# Patient Record
Sex: Male | Born: 1970 | Race: White | Hispanic: No | Marital: Single | Smoking: Current some day smoker
Health system: Southern US, Community
[De-identification: ages and names within clinical notes are randomized; demographics above are authoritative.]

## PROBLEM LIST (undated history)

## (undated) DIAGNOSIS — K029 Dental caries, unspecified: Secondary | ICD-10-CM

## (undated) DIAGNOSIS — E7800 Pure hypercholesterolemia, unspecified: Secondary | ICD-10-CM

## (undated) HISTORY — PX: OTHER SURGICAL HISTORY: SHX169

---

## 2017-07-25 NOTE — H&P (Signed)
HISTORY AND PHYSICAL  Richard Oneal is a 47 y.o. male patient with CC: Referred by general dentist for multiple extractions in preparation for dentures.  No diagnosis found.  No past medical history on file.  No current facility-administered medications for this encounter.    Current Outpatient Medications  Medication Sig Dispense Refill  . atorvastatin (LIPITOR) 20 MG tablet Take 20 mg by mouth daily.     No Known Allergies Active Problems:   * No active hospital problems. *  Vitals: There were no vitals taken for this visit. Lab results:No results found for this or any previous visit (from the past 24 hour(s)). Radiology Results: No results found. General appearance: alert, cooperative and no distress Head: Normocephalic, without obvious abnormality, atraumatic Eyes: negative Nose: Nares normal. Septum midline. Mucosa normal. No drainage or sinus tenderness. Throat: multiple carious teeth. Periodontal diseases. Pharynx clear Neck: no adenopathy, supple, symmetrical, trachea midline and thyroid not enlarged, symmetric, no tenderness/mass/nodules Resp: clear to auscultation bilaterally Cardio: regular rate and rhythm, S1, S2 normal, no murmur, click, rub or gallop  Assessment: Nonrestorable teeth secondary to dental caries    Plan: Full mouth extractions with alveoloplasty. GA. Day surgery.   Ocie Doyne 07/25/2017

## 2017-07-26 ENCOUNTER — Other Ambulatory Visit: Payer: Self-pay

## 2017-07-26 ENCOUNTER — Encounter (HOSPITAL_COMMUNITY): Payer: Self-pay | Admitting: *Deleted

## 2017-07-26 NOTE — Progress Notes (Signed)
Pt denies SOB, chest pain, and being under the care of a cardiologist. Pt denies having a stress test, echo and cardiac cath. Pt denies having an EKG and chest x ray within the last year. Pt denies recent labs. Pt made aware to stop taking  Aspirin, vitamins, fish oil and herbal medications. Do not take any NSAIDs ie: Ibuprofen, Advil, Naproxen (Aleve), Motrin, BC and Goody Powder. Pt verbalized understanding of all pre-op instructions. 

## 2017-07-27 ENCOUNTER — Ambulatory Visit (HOSPITAL_COMMUNITY): Payer: Medicaid Other | Admitting: Anesthesiology

## 2017-07-27 ENCOUNTER — Encounter (HOSPITAL_COMMUNITY)
Admission: RE | Disposition: A | Discharge status: Home / Self Care | Payer: Self-pay | Source: Ambulatory Visit | Attending: Oral Surgery

## 2017-07-27 ENCOUNTER — Ambulatory Visit (HOSPITAL_COMMUNITY)
Admission: RE | Admit: 2017-07-27 | Discharge: 2017-07-27 | Disposition: A | Discharge status: Home / Self Care | Payer: Medicaid Other | Source: Ambulatory Visit | Attending: Oral Surgery | Admitting: Oral Surgery

## 2017-07-27 ENCOUNTER — Encounter (HOSPITAL_COMMUNITY): Payer: Self-pay | Admitting: *Deleted

## 2017-07-27 DIAGNOSIS — K029 Dental caries, unspecified: Secondary | ICD-10-CM | POA: Diagnosis not present

## 2017-07-27 DIAGNOSIS — K0530 Chronic periodontitis, unspecified: Secondary | ICD-10-CM | POA: Diagnosis not present

## 2017-07-27 HISTORY — PX: MULTIPLE EXTRACTIONS WITH ALVEOLOPLASTY: SHX5342

## 2017-07-27 HISTORY — DX: Pure hypercholesterolemia, unspecified: E78.00

## 2017-07-27 HISTORY — DX: Dental caries, unspecified: K02.9

## 2017-07-27 LAB — CBC
HCT: 45.9 % (ref 39.0–52.0)
Hemoglobin: 15.5 g/dL (ref 13.0–17.0)
MCH: 31.5 pg (ref 26.0–34.0)
MCHC: 33.8 g/dL (ref 30.0–36.0)
MCV: 93.3 fL (ref 78.0–100.0)
Platelets: 225 10*3/uL (ref 150–400)
RBC: 4.92 MIL/uL (ref 4.22–5.81)
RDW: 12.7 % (ref 11.5–15.5)
WBC: 9.2 10*3/uL (ref 4.0–10.5)

## 2017-07-27 LAB — BASIC METABOLIC PANEL
Anion gap: 9 (ref 5–15)
BUN: 9 mg/dL (ref 6–20)
CALCIUM: 8.8 mg/dL — AB (ref 8.9–10.3)
CO2: 24 mmol/L (ref 22–32)
Chloride: 102 mmol/L (ref 101–111)
Creatinine, Ser: 0.85 mg/dL (ref 0.61–1.24)
GFR calc Af Amer: >60 mL/min (ref >60)
GLUCOSE: 96 mg/dL (ref 65–99)
Potassium: 3.8 mmol/L (ref 3.5–5.1)
Sodium: 135 mmol/L (ref 135–145)

## 2017-07-27 SURGERY — MULTIPLE EXTRACTION WITH ALVEOLOPLASTY
Anesthesia: General | Site: Mouth

## 2017-07-27 MED ORDER — OXYCODONE-ACETAMINOPHEN 5-325 MG PO TABS
1.0000 | ORAL_TABLET | ORAL | 0 refills | Status: DC | PRN
Start: 2017-07-27 — End: 2021-02-10

## 2017-07-27 MED ORDER — ROCURONIUM BROMIDE 10 MG/ML (PF) SYRINGE
PREFILLED_SYRINGE | INTRAVENOUS | Status: AC
  Filled 2017-07-27: qty 5

## 2017-07-27 MED ORDER — LACTATED RINGERS IV SOLN: INTRAVENOUS | Status: DC

## 2017-07-27 MED ORDER — SUGAMMADEX SODIUM 200 MG/2ML IV SOLN
INTRAVENOUS | Status: AC
  Filled 2017-07-27: qty 2

## 2017-07-27 MED ORDER — DEXAMETHASONE SODIUM PHOSPHATE 10 MG/ML IJ SOLN
INTRAMUSCULAR | Status: DC | PRN
  Administered 2017-07-27: 10 mg via INTRAVENOUS

## 2017-07-27 MED ORDER — CEFAZOLIN SODIUM-DEXTROSE 2-4 GM/100ML-% IV SOLN
2.0000 g | INTRAVENOUS | Status: AC
  Administered 2017-07-27: 2 g via INTRAVENOUS
  Filled 2017-07-27: qty 100

## 2017-07-27 MED ORDER — FENTANYL CITRATE (PF) 100 MCG/2ML IJ SOLN
INTRAMUSCULAR | Status: DC | PRN
  Administered 2017-07-27: 50 ug via INTRAVENOUS
  Administered 2017-07-27: 100 ug via INTRAVENOUS

## 2017-07-27 MED ORDER — METOCLOPRAMIDE HCL 5 MG/ML IJ SOLN: 10.0000 mg | Freq: Once | INTRAMUSCULAR | Status: DC | PRN

## 2017-07-27 MED ORDER — DEXAMETHASONE SODIUM PHOSPHATE 10 MG/ML IJ SOLN
INTRAMUSCULAR | Status: AC
  Filled 2017-07-27: qty 1

## 2017-07-27 MED ORDER — LIDOCAINE-EPINEPHRINE 2 %-1:100000 IJ SOLN
INTRAMUSCULAR | Status: AC
  Filled 2017-07-27: qty 1

## 2017-07-27 MED ORDER — 0.9 % SODIUM CHLORIDE (POUR BTL) OPTIME
TOPICAL | Status: DC | PRN
  Administered 2017-07-27: 1000 mL

## 2017-07-27 MED ORDER — MIDAZOLAM HCL 2 MG/2ML IJ SOLN
INTRAMUSCULAR | Status: AC
  Filled 2017-07-27: qty 2

## 2017-07-27 MED ORDER — FENTANYL CITRATE (PF) 100 MCG/2ML IJ SOLN: 25.0000 ug | INTRAMUSCULAR | Status: DC | PRN

## 2017-07-27 MED ORDER — PROPOFOL 10 MG/ML IV BOLUS
INTRAVENOUS | Status: DC | PRN
  Administered 2017-07-27: 180 mg via INTRAVENOUS

## 2017-07-27 MED ORDER — SUGAMMADEX SODIUM 200 MG/2ML IV SOLN
INTRAVENOUS | Status: DC | PRN
  Administered 2017-07-27: 200 mg via INTRAVENOUS

## 2017-07-27 MED ORDER — PROPOFOL 10 MG/ML IV BOLUS
INTRAVENOUS | Status: AC
  Filled 2017-07-27: qty 20

## 2017-07-27 MED ORDER — ONDANSETRON HCL 4 MG/2ML IJ SOLN
INTRAMUSCULAR | Status: DC | PRN
  Administered 2017-07-27: 4 mg via INTRAVENOUS

## 2017-07-27 MED ORDER — ROCURONIUM BROMIDE 100 MG/10ML IV SOLN
INTRAVENOUS | Status: DC | PRN
  Administered 2017-07-27: 40 mg via INTRAVENOUS

## 2017-07-27 MED ORDER — MIDAZOLAM HCL 5 MG/5ML IJ SOLN
INTRAMUSCULAR | Status: DC | PRN
  Administered 2017-07-27: 2 mg via INTRAVENOUS

## 2017-07-27 MED ORDER — FENTANYL CITRATE (PF) 250 MCG/5ML IJ SOLN
INTRAMUSCULAR | Status: AC
  Filled 2017-07-27: qty 5

## 2017-07-27 MED ORDER — AMOXICILLIN 500 MG PO CAPS: 500.0000 mg | ORAL_CAPSULE | Freq: Three times a day (TID) | ORAL | 0 refills | Status: DC

## 2017-07-27 MED ORDER — MEPERIDINE HCL 50 MG/ML IJ SOLN: 6.2500 mg | INTRAMUSCULAR | Status: DC | PRN

## 2017-07-27 MED ORDER — OXYMETAZOLINE HCL 0.05 % NA SOLN
NASAL | Status: DC | PRN
  Administered 2017-07-27: 1

## 2017-07-27 MED ORDER — LIDOCAINE-EPINEPHRINE 2 %-1:100000 IJ SOLN
INTRAMUSCULAR | Status: DC | PRN
  Administered 2017-07-27: 17 mL via INTRADERMAL

## 2017-07-27 MED ORDER — ONDANSETRON HCL 4 MG/2ML IJ SOLN
INTRAMUSCULAR | Status: AC
  Filled 2017-07-27: qty 2

## 2017-07-27 MED ORDER — SODIUM CHLORIDE 0.9 % IR SOLN
Status: DC | PRN
  Administered 2017-07-27: 1000 mL

## 2017-07-27 MED ORDER — OXYMETAZOLINE HCL 0.05 % NA SOLN
NASAL | Status: DC | PRN
  Administered 2017-07-27 (×2): 1 via NASAL

## 2017-07-27 MED ORDER — LIDOCAINE 2% (20 MG/ML) 5 ML SYRINGE
INTRAMUSCULAR | Status: AC
  Filled 2017-07-27: qty 5

## 2017-07-27 MED ORDER — LACTATED RINGERS IV SOLN
INTRAVENOUS | Status: DC | PRN
Start: 2017-07-27 — End: 2017-07-27
  Administered 2017-07-27: 07:00:00 via INTRAVENOUS

## 2017-07-27 MED ORDER — LIDOCAINE HCL (CARDIAC) PF 100 MG/5ML IV SOSY
PREFILLED_SYRINGE | INTRAVENOUS | Status: DC | PRN
  Administered 2017-07-27: 100 mg via INTRAVENOUS

## 2017-07-27 SURGICAL SUPPLY — 32 items
BUR CROSS CUT FISSURE 1.6 (BURR) ×2 IMPLANT
BUR CROSS CUT FISSURE 1.6MM (BURR) ×1
BUR EGG ELITE 4.0 (BURR) ×2 IMPLANT
BUR EGG ELITE 4.0MM (BURR) ×1
CANISTER SUCT 3000ML PPV (MISCELLANEOUS) ×3 IMPLANT
COVER SURGICAL LIGHT HANDLE (MISCELLANEOUS) ×3 IMPLANT
CRADLE DONUT ADULT HEAD (MISCELLANEOUS) ×3 IMPLANT
DECANTER SPIKE VIAL GLASS SM (MISCELLANEOUS) IMPLANT
DRAPE U-SHAPE 76X120 STRL (DRAPES) ×3 IMPLANT
FLUID NSS /IRRIG 1000 ML XXX (MISCELLANEOUS) ×3 IMPLANT
GAUZE PACKING FOLDED 2  STR (GAUZE/BANDAGES/DRESSINGS) ×2
GAUZE PACKING FOLDED 2 STR (GAUZE/BANDAGES/DRESSINGS) ×1 IMPLANT
GLOVE BIO SURGEON STRL SZ 6.5 (GLOVE) ×2 IMPLANT
GLOVE BIO SURGEON STRL SZ7.5 (GLOVE) ×3 IMPLANT
GLOVE BIO SURGEONS STRL SZ 6.5 (GLOVE) ×1
GLOVE BIOGEL PI IND STRL 7.0 (GLOVE) IMPLANT
GLOVE BIOGEL PI INDICATOR 7.0 (GLOVE)
GOWN STRL REUS W/ TWL LRG LVL3 (GOWN DISPOSABLE) ×1 IMPLANT
GOWN STRL REUS W/ TWL XL LVL3 (GOWN DISPOSABLE) ×1 IMPLANT
GOWN STRL REUS W/TWL LRG LVL3 (GOWN DISPOSABLE) ×2
GOWN STRL REUS W/TWL XL LVL3 (GOWN DISPOSABLE) ×2
KIT BASIN OR (CUSTOM PROCEDURE TRAY) ×3 IMPLANT
KIT TURNOVER KIT B (KITS) ×3 IMPLANT
NEEDLE 22X1 1/2 (OR ONLY) (NEEDLE) ×6 IMPLANT
NEEDLE 27GAX1X1/2 (NEEDLE) IMPLANT
NS IRRIG 1000ML POUR BTL (IV SOLUTION) ×3 IMPLANT
PAD ARMBOARD 7.5X6 YLW CONV (MISCELLANEOUS) ×3 IMPLANT
SUT CHROMIC 3 0 PS 2 (SUTURE) ×3 IMPLANT
SYR CONTROL 10ML LL (SYRINGE) ×3 IMPLANT
TRAY ENT MC OR (CUSTOM PROCEDURE TRAY) ×3 IMPLANT
TUBING IRRIGATION (MISCELLANEOUS) ×3 IMPLANT
YANKAUER SUCT BULB TIP NO VENT (SUCTIONS) ×3 IMPLANT

## 2017-07-27 NOTE — Op Note (Signed)
07/27/2017  8:15 AM  PATIENT:  Richard Oneal  47 y.o. male  PRE-OPERATIVE DIAGNOSIS:  NON RESTORABLE teeth 2,3,4,5,6,12,15,19,20,21,22,27,28,29  POST-OPERATIVE DIAGNOSIS:  SAME  PROCEDURE:  Procedure(s): EXTRACTION teeth 2,3,4,5,6,12,15,19,20,21,22,27,28,29 WITH ALVEOLOPLASTY  SURGEON:  Surgeon(s): Ocie Doyne, DDS  ANESTHESIA:   local and general  EBL:  minimal  DRAINS: none   SPECIMEN:  No Specimen  COUNTS:  YES  PLAN OF CARE: Discharge to home after PACU  PATIENT DISPOSITION:  PACU - hemodynamically stable.   PROCEDURE DETAILS: Dictation # [id number]  Georgia Lopes, DMD 07/27/2017 8:15 AM

## 2017-07-27 NOTE — Anesthesia Procedure Notes (Addendum)
Procedure Name: Intubation Date/Time: 07/27/2017 7:38 AM Performed by: Jenne Campus, CRNA Pre-anesthesia Checklist: Patient identified, Emergency Drugs available, Suction available and Patient being monitored Patient Re-evaluated:Patient Re-evaluated prior to induction Oxygen Delivery Method: Circle System Utilized Preoxygenation: Pre-oxygenation with 100% oxygen Induction Type: IV induction Ventilation: Mask ventilation without difficulty Laryngoscope Size: Mac and 3 Grade View: Grade I Nasal Tubes: Right, Nasal prep performed, Nasal Rae and Magill forceps- large, utilized Tube size: 7.0 mm Number of attempts: 1 Placement Confirmation: ETT inserted through vocal cords under direct vision,  positive ETCO2 and breath sounds checked- equal and bilateral Secured at: 28 cm Tube secured with: Tape Dental Injury: Teeth and Oropharynx as per pre-operative assessment

## 2017-07-27 NOTE — H&P (Signed)
H&P documentation  -History and Physical Reviewed  -Patient has been re-examined  -No change in the plan of care  Richard Oneal  

## 2017-07-27 NOTE — Anesthesia Preprocedure Evaluation (Addendum)
Anesthesia Evaluation  Patient identified by MRN, date of birth, ID band Patient awake    Reviewed: Allergy & Precautions, NPO status , Patient's Chart, lab work & pertinent test results  Airway Mallampati: II  TM Distance: >3 FB Neck ROM: Full    Dental no notable dental hx. (+) Poor Dentition, Loose, Missing, Chipped, Dental Advisory Given   Pulmonary neg pulmonary ROS, Current Smoker,    Pulmonary exam normal breath sounds clear to auscultation       Cardiovascular negative cardio ROS Normal cardiovascular exam Rhythm:Regular Rate:Normal     Neuro/Psych negative neurological ROS  negative psych ROS   GI/Hepatic negative GI ROS, Neg liver ROS,   Endo/Other  negative endocrine ROS  Renal/GU negative Renal ROS  negative genitourinary   Musculoskeletal negative musculoskeletal ROS (+)   Abdominal   Peds negative pediatric ROS (+)  Hematology negative hematology ROS (+)   Anesthesia Other Findings   Reproductive/Obstetrics negative OB ROS                            Anesthesia Physical Anesthesia Plan  ASA: II  Anesthesia Plan: General   Post-op Pain Management:    Induction: Intravenous  PONV Risk Score and Plan: 1 and Ondansetron and Treatment may vary due to age or medical condition  Airway Management Planned: Nasal ETT  Additional Equipment:   Intra-op Plan:   Post-operative Plan: Extubation in OR  Informed Consent: I have reviewed the patients History and Physical, chart, labs and discussed the procedure including the risks, benefits and alternatives for the proposed anesthesia with the patient or authorized representative who has indicated his/her understanding and acceptance.   Dental advisory given  Plan Discussed with: CRNA  Anesthesia Plan Comments:         Anesthesia Quick Evaluation

## 2017-07-27 NOTE — Transfer of Care (Signed)
Immediate Anesthesia Transfer of Care Note  Patient: Brody Bonneau  Procedure(s) Performed: EXTRACTION teeth 2,3,4,5,6,12,15,19,20,21,22,27,28,29 WITH ALVEOLOPLASTY (N/A Mouth)  Patient Location: PACU  Anesthesia Type:General  Level of Consciousness: awake, alert  and patient cooperative  Airway & Oxygen Therapy: Patient Spontanous Breathing  Post-op Assessment: Report given to RN and Post -op Vital signs reviewed and stable  Post vital signs: Reviewed and stable  Last Vitals:  Vitals Value Taken Time  BP 146/88 07/27/2017  8:22 AM  Temp    Pulse 77 07/27/2017  8:23 AM  Resp 24 07/27/2017  8:23 AM  SpO2 95 % 07/27/2017  8:23 AM  Vitals shown include unvalidated device data.  Last Pain:  Vitals:   07/27/17 0622  TempSrc:   PainSc: 0-No pain      Patients Stated Pain Goal: 4 (07/27/17 0622)  Complications: No apparent anesthesia complications

## 2017-07-27 NOTE — Op Note (Signed)
Richard Oneal, SILVIO MEDICAL RECORD [id number] ACCOUNT [id number] DATE OF BIRTH:05-10-1970 FACILITY: [facility] LOCATION: [facility] PHYSICIAN:Kendre Sires M. Jamarcus Laduke, DDS  OPERATIVE REPORT  DATE OF PROCEDURE:  07/27/2017  PREOPERATIVE DIAGNOSIS:  Nonrestorable teeth secondary to dental caries and periodontal disease, #2, #3, #4, #5, #6, #12, #15, #19, #20, #21, #22, #27, #28, #29.  POSTOPERATIVE DIAGNOSIS:  Nonrestorable teeth secondary to dental caries and periodontal disease, #2, #3, #4, #5, #6, #12, #15, #19, #20, #21, #22, #27, #28, #29.  PROCEDURE:  Extraction of teeth #2, #3, #4, #5, #6, #12, #15, #19, #20, #21, #22, #27, #28, #29, alveoplasty right and left maxilla and mandible.  SURGEON:  Ocie Doyne, DDS.  ANESTHESIA:  General nasal intubation.  DESCRIPTION OF PROCEDURE:  The patient was taken to the operating room and placed on the table in supine position.  General anesthesia was administered intravenously and a nasal endotracheal tube was placed and secured.  The eyes were protected.  The  patient was draped for surgery.  A timeout was performed.  The posterior pharynx was suctioned and a throat pack was placed.  Lidocaine 2% 1:100,000 epinephrine was infiltrated and an inferior alveolar block on the right and left sides and a buccal and  palatal infiltration of the maxilla.  A total of 17 mL was utilized.  A bite block was placed on the right side of the mouth and a sweetheart retractor was used to retract the tongue.  The left side was operated first.  A #15 blade was used to make an  incision around teeth numbers #19, #20, #21, #22 with an extension both proximally and distally of 1 cm on the alveolar crest.  Incision was made in both the buccal and lingual sulcus.  Another incision was made in the left maxilla beginning 1 cm  proximal to tooth #15 carried forward around the tooth and the gingival sulcus and forward along the alveolar crest and then around tooth #12 buccally and  palatally and then forward for 1 more cm distally.  The periosteum was then reflected from around  these teeth.  The teeth were elevated with a 301 elevator.  The lower teeth were removed with the lower Universal forceps, the upper teeth were removed with the upper Universal forceps.  Tooth #15 fractured upon removal requiring removal of additional  bone around the mesiobuccal root.  The root was then removed with a 301 elevator.  The periosteum was reflected to expose the alveolar crest.  The sockets were curetted and then alveoplasty was performed with an egg-shaped bur and bone file then the  areas were irrigated and closed with 3-0 chromic.  The bite block and sweetheart retractor were repositioned to the other side of the mouth and a similar incision was made around teeth numbers #2, #3, #4, #5 and #6 and around teeth numbers #27, #28 and  #29.  Periosteum was reflected.  The teeth were elevated with a 301 elevator and removed from the mouth with dental forceps.  The sockets were curetted.  The periosteum was reflected to expose the alveolar crest and alveoplasty was performed using the  egg-shaped bur and bone file.  The area was irrigated and closed with 3-0 chromic.  Then, the oral cavity was palpated and found to have good contour, hemostasis, closure.  The oral cavity was irrigated and suctioned, throat pack was removed.  The  patient was left in the care of anesthesia for extubation and transport to recovery room with plans for discharge home through day  surgery.  ESTIMATED BLOOD LOSS:  Minimum.  COMPLICATIONS:  None.  SPECIMENS:  None.  TN/NUANCE  D:07/27/2017 T:07/27/2017 JOB:[id number]

## 2017-07-27 NOTE — Anesthesia Postprocedure Evaluation (Signed)
Anesthesia Post Note  Patient: Richard Oneal  Procedure(s) Performed: EXTRACTION teeth [phone],3,4,5,6,12,15,19,20,21,22,27,28,29 WITH ALVEOLOPLASTY (N/A Mouth)     Patient location during evaluation: PACU Anesthesia Type: General Level of consciousness: awake and alert Pain management: pain level controlled Vital Signs Assessment: post-procedure vital signs reviewed and stable Respiratory status: spontaneous breathing, nonlabored ventilation, respiratory function stable and patient connected to nasal cannula oxygen Cardiovascular status: blood pressure returned to baseline and stable Postop Assessment: no apparent nausea or vomiting Anesthetic complications: no    Last Vitals:  Vitals:   07/27/17 0900 07/27/17 0901  BP:    Pulse: 70   Resp: 20   Temp:  36.7 C  SpO2: 95%     Last Pain:  Vitals:   07/27/17 0850  TempSrc:   PainSc: 0-No pain                 Phillips Groutarignan, Anthonella Klausner

## 2017-07-28 ENCOUNTER — Encounter (HOSPITAL_COMMUNITY): Payer: Self-pay | Admitting: Oral Surgery

## 2021-02-08 ENCOUNTER — Emergency Department (HOSPITAL_COMMUNITY): Payer: Medicaid Other | Admitting: Certified Registered"

## 2021-02-08 ENCOUNTER — Inpatient Hospital Stay (HOSPITAL_COMMUNITY): Payer: Medicaid Other

## 2021-02-08 ENCOUNTER — Inpatient Hospital Stay
Admission: AD | Admit: 2021-02-08 | Payer: Medicaid Other | Source: Other Acute Inpatient Hospital | Admitting: Student in an Organized Health Care Education/Training Program

## 2021-02-08 ENCOUNTER — Inpatient Hospital Stay: Admit: 2021-02-08 | Payer: Medicaid Other

## 2021-02-08 ENCOUNTER — Inpatient Hospital Stay (HOSPITAL_COMMUNITY)
Admission: EM | Admit: 2021-02-08 | Discharge: 2021-02-27 | DRG: 064 | Disposition: E | Payer: Medicaid Other | Attending: Pulmonary Disease | Admitting: Pulmonary Disease

## 2021-02-08 ENCOUNTER — Encounter (HOSPITAL_COMMUNITY): Admission: EM | Disposition: E | Payer: Self-pay | Source: Home / Self Care | Attending: Neurology

## 2021-02-08 ENCOUNTER — Emergency Department (HOSPITAL_COMMUNITY): Payer: Medicaid Other

## 2021-02-08 DIAGNOSIS — R112 Nausea with vomiting, unspecified: Secondary | ICD-10-CM

## 2021-02-08 DIAGNOSIS — J9589 Other postprocedural complications and disorders of respiratory system, not elsewhere classified: Secondary | ICD-10-CM | POA: Diagnosis not present

## 2021-02-08 DIAGNOSIS — Z8249 Family history of ischemic heart disease and other diseases of the circulatory system: Secondary | ICD-10-CM | POA: Diagnosis not present

## 2021-02-08 DIAGNOSIS — G911 Obstructive hydrocephalus: Secondary | ICD-10-CM | POA: Diagnosis not present

## 2021-02-08 DIAGNOSIS — Z66 Do not resuscitate: Secondary | ICD-10-CM | POA: Diagnosis not present

## 2021-02-08 DIAGNOSIS — R29810 Facial weakness: Secondary | ICD-10-CM | POA: Diagnosis present

## 2021-02-08 DIAGNOSIS — I63449 Cerebral infarction due to embolism of unspecified cerebellar artery: Secondary | ICD-10-CM | POA: Diagnosis present

## 2021-02-08 DIAGNOSIS — F17290 Nicotine dependence, other tobacco product, uncomplicated: Secondary | ICD-10-CM | POA: Diagnosis present

## 2021-02-08 DIAGNOSIS — G9382 Brain death: Secondary | ICD-10-CM | POA: Diagnosis not present

## 2021-02-08 DIAGNOSIS — I10 Essential (primary) hypertension: Secondary | ICD-10-CM | POA: Diagnosis present

## 2021-02-08 DIAGNOSIS — I651 Occlusion and stenosis of basilar artery: Secondary | ICD-10-CM

## 2021-02-08 DIAGNOSIS — I63233 Cerebral infarction due to unspecified occlusion or stenosis of bilateral carotid arteries: Principal | ICD-10-CM

## 2021-02-08 DIAGNOSIS — I63113 Cerebral infarction due to embolism of bilateral vertebral arteries: Secondary | ICD-10-CM | POA: Diagnosis present

## 2021-02-08 DIAGNOSIS — G935 Compression of brain: Secondary | ICD-10-CM | POA: Diagnosis not present

## 2021-02-08 DIAGNOSIS — Z529 Donor of unspecified organ or tissue: Secondary | ICD-10-CM | POA: Diagnosis not present

## 2021-02-08 DIAGNOSIS — R262 Difficulty in walking, not elsewhere classified: Secondary | ICD-10-CM | POA: Diagnosis present

## 2021-02-08 DIAGNOSIS — I7774 Dissection of vertebral artery: Secondary | ICD-10-CM | POA: Diagnosis not present

## 2021-02-08 DIAGNOSIS — Z79890 Hormone replacement therapy (postmenopausal): Secondary | ICD-10-CM

## 2021-02-08 DIAGNOSIS — Z515 Encounter for palliative care: Secondary | ICD-10-CM

## 2021-02-08 DIAGNOSIS — K279 Peptic ulcer, site unspecified, unspecified as acute or chronic, without hemorrhage or perforation: Secondary | ICD-10-CM | POA: Diagnosis present

## 2021-02-08 DIAGNOSIS — R4189 Other symptoms and signs involving cognitive functions and awareness: Secondary | ICD-10-CM | POA: Diagnosis present

## 2021-02-08 DIAGNOSIS — Z539 Procedure and treatment not carried out, unspecified reason: Secondary | ICD-10-CM | POA: Diagnosis not present

## 2021-02-08 DIAGNOSIS — E7800 Pure hypercholesterolemia, unspecified: Secondary | ICD-10-CM | POA: Diagnosis present

## 2021-02-08 DIAGNOSIS — I161 Hypertensive emergency: Secondary | ICD-10-CM | POA: Diagnosis present

## 2021-02-08 DIAGNOSIS — R471 Dysarthria and anarthria: Secondary | ICD-10-CM | POA: Diagnosis present

## 2021-02-08 DIAGNOSIS — I6312 Cerebral infarction due to embolism of basilar artery: Secondary | ICD-10-CM | POA: Diagnosis present

## 2021-02-08 DIAGNOSIS — I6389 Other cerebral infarction: Secondary | ICD-10-CM | POA: Diagnosis not present

## 2021-02-08 DIAGNOSIS — I69328 Other speech and language deficits following cerebral infarction: Secondary | ICD-10-CM

## 2021-02-08 DIAGNOSIS — I959 Hypotension, unspecified: Secondary | ICD-10-CM | POA: Diagnosis not present

## 2021-02-08 DIAGNOSIS — Z79899 Other long term (current) drug therapy: Secondary | ICD-10-CM | POA: Diagnosis not present

## 2021-02-08 DIAGNOSIS — Z452 Encounter for adjustment and management of vascular access device: Secondary | ICD-10-CM | POA: Diagnosis not present

## 2021-02-08 DIAGNOSIS — G919 Hydrocephalus, unspecified: Secondary | ICD-10-CM | POA: Diagnosis present

## 2021-02-08 DIAGNOSIS — I6503 Occlusion and stenosis of bilateral vertebral arteries: Secondary | ICD-10-CM | POA: Diagnosis not present

## 2021-02-08 DIAGNOSIS — J9601 Acute respiratory failure with hypoxia: Secondary | ICD-10-CM | POA: Diagnosis present

## 2021-02-08 DIAGNOSIS — J9690 Respiratory failure, unspecified, unspecified whether with hypoxia or hypercapnia: Secondary | ICD-10-CM

## 2021-02-08 DIAGNOSIS — Z9289 Personal history of other medical treatment: Secondary | ICD-10-CM

## 2021-02-08 DIAGNOSIS — R29702 NIHSS score 2: Secondary | ICD-10-CM | POA: Diagnosis present

## 2021-02-08 DIAGNOSIS — E876 Hypokalemia: Secondary | ICD-10-CM

## 2021-02-08 DIAGNOSIS — Z9911 Dependence on respirator [ventilator] status: Secondary | ICD-10-CM | POA: Diagnosis not present

## 2021-02-08 DIAGNOSIS — I63213 Cerebral infarction due to unspecified occlusion or stenosis of bilateral vertebral arteries: Secondary | ICD-10-CM | POA: Diagnosis not present

## 2021-02-08 DIAGNOSIS — R1110 Vomiting, unspecified: Secondary | ICD-10-CM

## 2021-02-08 DIAGNOSIS — Z20822 Contact with and (suspected) exposure to covid-19: Secondary | ICD-10-CM | POA: Diagnosis present

## 2021-02-08 DIAGNOSIS — Z0189 Encounter for other specified special examinations: Secondary | ICD-10-CM

## 2021-02-08 DIAGNOSIS — D649 Anemia, unspecified: Secondary | ICD-10-CM | POA: Diagnosis present

## 2021-02-08 HISTORY — PX: RADIOLOGY WITH ANESTHESIA: SHX6223

## 2021-02-08 HISTORY — PX: IR ANGIO VERTEBRAL SEL VERTEBRAL UNI R MOD SED: IMG5368

## 2021-02-08 HISTORY — PX: IR ANGIO INTRA EXTRACRAN SEL INTERNAL CAROTID BILAT MOD SED: IMG5363

## 2021-02-08 HISTORY — PX: IR CT HEAD LTD: IMG2386

## 2021-02-08 HISTORY — PX: IR PERCUTANEOUS ART THROMBECTOMY/INFUSION INTRACRANIAL INC DIAG ANGIO: IMG6087

## 2021-02-08 HISTORY — PX: IR US GUIDE VASC ACCESS RIGHT: IMG2390

## 2021-02-08 LAB — RAPID URINE DRUG SCREEN, HOSP PERFORMED
Amphetamines: NOT DETECTED
Barbiturates: NOT DETECTED
Benzodiazepines: NOT DETECTED
Cocaine: NOT DETECTED
Opiates: NOT DETECTED
Tetrahydrocannabinol: POSITIVE — AB

## 2021-02-08 LAB — COMPREHENSIVE METABOLIC PANEL
ALT: 18 U/L (ref 0–44)
AST: 17 U/L (ref 15–41)
Albumin: 4.6 g/dL (ref 3.5–5.0)
Alkaline Phosphatase: 55 U/L (ref 38–126)
Anion gap: 10 (ref 5–15)
BUN: 9 mg/dL (ref 6–20)
CO2: 23 mmol/L (ref 22–32)
Calcium: 9.1 mg/dL (ref 8.9–10.3)
Chloride: 106 mmol/L (ref 98–111)
Creatinine, Ser: 0.74 mg/dL (ref 0.61–1.24)
GFR, Estimated: >60 mL/min (ref >60)
Glucose, Bld: 130 mg/dL — ABNORMAL HIGH (ref 70–99)
Potassium: 3.5 mmol/L (ref 3.5–5.1)
Sodium: 139 mmol/L (ref 135–145)
Total Bilirubin: 0.9 mg/dL (ref 0.3–1.2)
Total Protein: 7.5 g/dL (ref 6.5–8.1)

## 2021-02-08 LAB — POCT I-STAT 7, (LYTES, BLD GAS, ICA,H+H)
Acid-base deficit: 1 mmol/L (ref 0.0–2.0)
Bicarbonate: 24.9 mmol/L (ref 20.0–28.0)
Calcium, Ion: 1.18 mmol/L (ref 1.15–1.40)
HCT: 44 % (ref 39.0–52.0)
Hemoglobin: 15 g/dL (ref 13.0–17.0)
O2 Saturation: 99 %
Potassium: 3.4 mmol/L — ABNORMAL LOW (ref 3.5–5.1)
Sodium: 139 mmol/L (ref 135–145)
TCO2: 26 mmol/L (ref 22–32)
pCO2 arterial: 42.8 mmHg (ref 32.0–48.0)
pH, Arterial: 7.373 (ref 7.350–7.450)
pO2, Arterial: 131 mmHg — ABNORMAL HIGH (ref 83.0–108.0)

## 2021-02-08 LAB — URINALYSIS, ROUTINE W REFLEX MICROSCOPIC
Bilirubin Urine: NEGATIVE
Glucose, UA: NEGATIVE mg/dL
Hgb urine dipstick: NEGATIVE
Ketones, ur: 15 mg/dL — AB
Leukocytes,Ua: NEGATIVE
Nitrite: NEGATIVE
Protein, ur: NEGATIVE mg/dL
Specific Gravity, Urine: 1.01 (ref 1.005–1.030)
pH: 7 (ref 5.0–8.0)

## 2021-02-08 LAB — GLUCOSE, CAPILLARY
Glucose-Capillary: 153 mg/dL — ABNORMAL HIGH (ref 70–99)
Glucose-Capillary: 149 mg/dL — ABNORMAL HIGH (ref 70–99)
Glucose-Capillary: 128 mg/dL — ABNORMAL HIGH (ref 70–99)

## 2021-02-08 LAB — CBC
HCT: 45.6 % (ref 39.0–52.0)
Hemoglobin: 15.8 g/dL (ref 13.0–17.0)
MCH: 30.8 pg (ref 26.0–34.0)
MCHC: 34.6 g/dL (ref 30.0–36.0)
MCV: 88.9 fL (ref 80.0–100.0)
Platelets: 246 10*3/uL (ref 150–400)
RBC: 5.13 MIL/uL (ref 4.22–5.81)
RDW: 12.1 % (ref 11.5–15.5)
WBC: 10.8 10*3/uL — ABNORMAL HIGH (ref 4.0–10.5)
nRBC: 0 % (ref 0.0–0.2)

## 2021-02-08 LAB — HIV ANTIBODY (ROUTINE TESTING W REFLEX): HIV Screen 4th Generation wRfx: NONREACTIVE

## 2021-02-08 LAB — MRSA NEXT GEN BY PCR, NASAL: MRSA by PCR Next Gen: NOT DETECTED

## 2021-02-08 IMAGING — XA IR PERCUTANEOUS ART THORMBECTOMY/INFUSION INTRACRANIAL INCLUDE D
11 of 16 series · 11 of 24 positions shown · IV contrast (IODINE)
Comparison: CT/CT angiogram of the head and neck [DATE].

INDICATION: 50-year-old male with past medical history significant for
hypertension hypercholesterolemia presented to outside hospital
complaining of headaches. There, he was noted to have slurred speech
and facial droop. His last known well was 8 p.m. on [DATE]. Head
CT showed no large acute territorial infarct. CT angiogram of the
head and neck showed bilateral intracranial vertebral artery and
proximal basilar artery occlusion. He was then transferred to our
service for a diagnostic cerebral angiogram and mechanical
thrombectomy.

EXAM:
ULTRASOUND-GUIDED VASCULAR [REDACTED] CEREBRAL ANGIOGRAM
ATTEMPTED BASILAR ARTERY REVASCULARIZATION
FLAT PANEL HEAD CT
TECHNIQUE: Informed written consent was obtained from the patient after a
thorough discussion of the procedural risks, benefits and
alternatives. All questions were addressed.

[Series 1: cerebral · 2 acquisitions, 1 frame shown (1 of 5)]
[im 1/2]
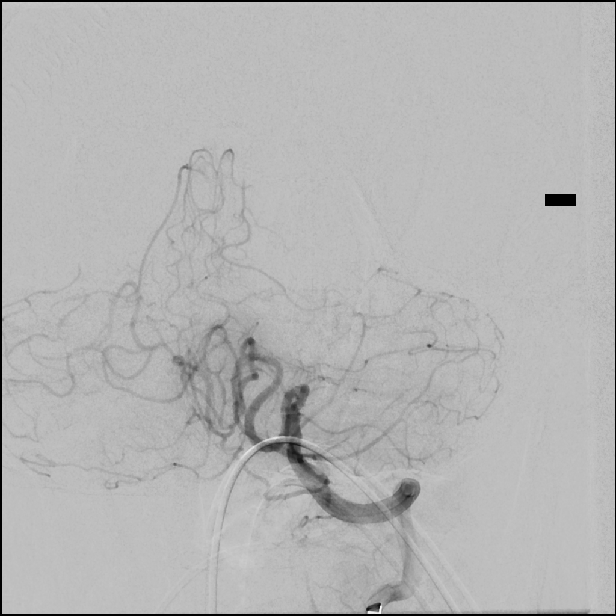

[Series 3: cerebral · 2 acquisitions, 1 frame shown (2 of 5)]
[im 1/2]
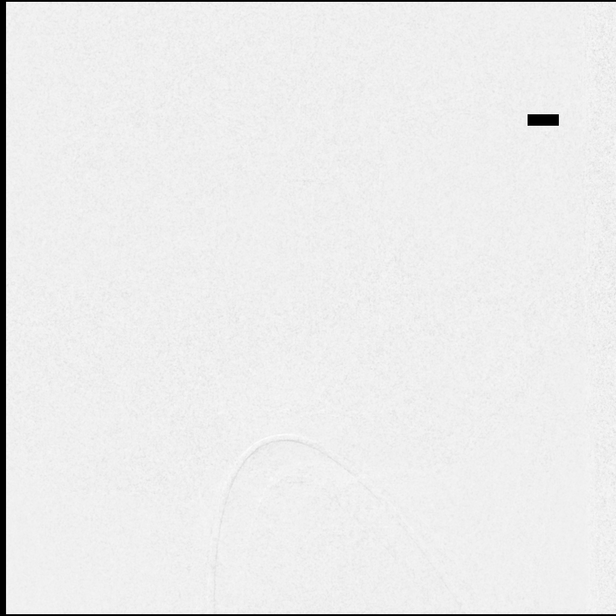

[Series 4: cerebral · 2 acquisitions, 1 frame shown (3 of 5)]
[im 1/2]
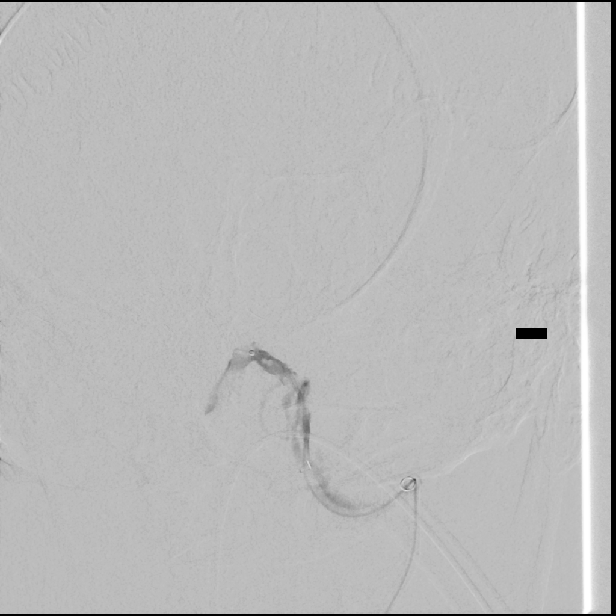

[Series 5: cerebral · 2 acquisitions, 1 frame shown (4 of 5)]
[im 1/2]
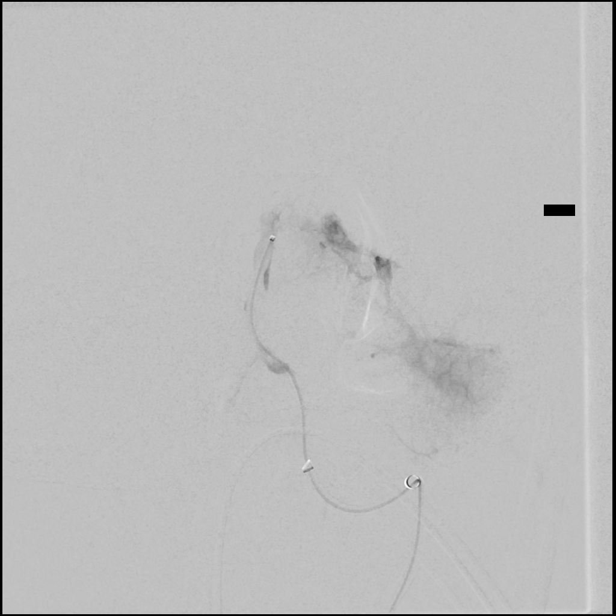

[Series 8: cerebral · 2 acquisitions, 1 frame shown (5 of 5)]
[im 1/2]
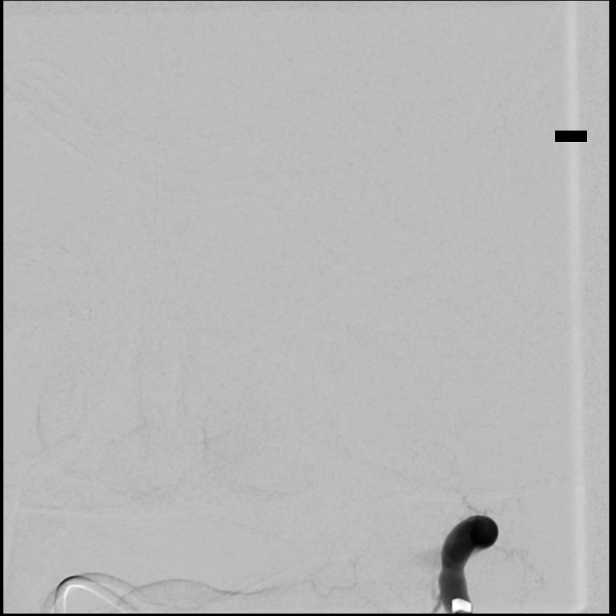

[Series 11: cerebral care 2 · 2 acquisitions, 1 frame shown (1 of 4)]
[im 1/2]
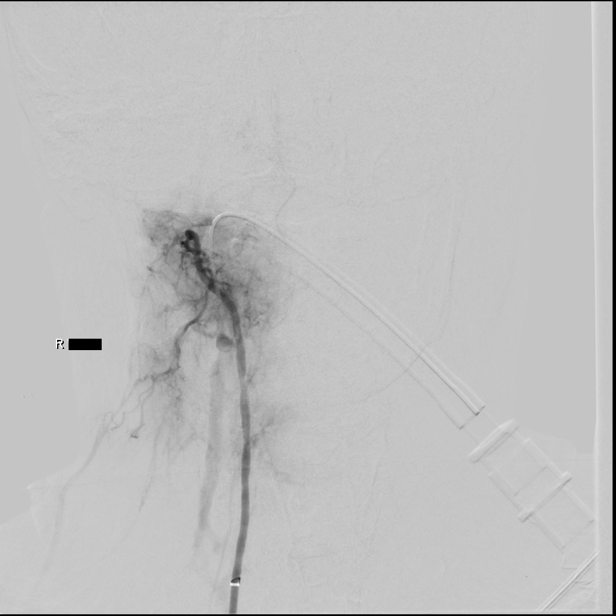

[Series 13: cerebral care 2 · 2 acquisitions, 1 frame shown (2 of 4)]
[im 1/2]
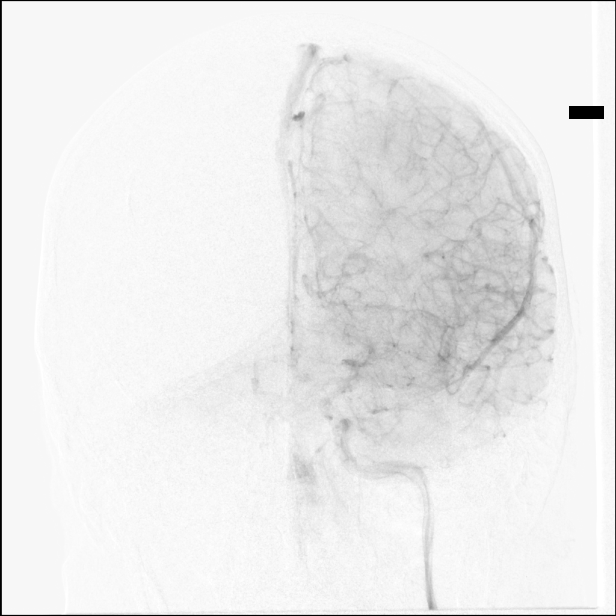

[Series 14: cerebral care 2 · 2 acquisitions, 1 frame shown (3 of 4)]
[im 1/2]
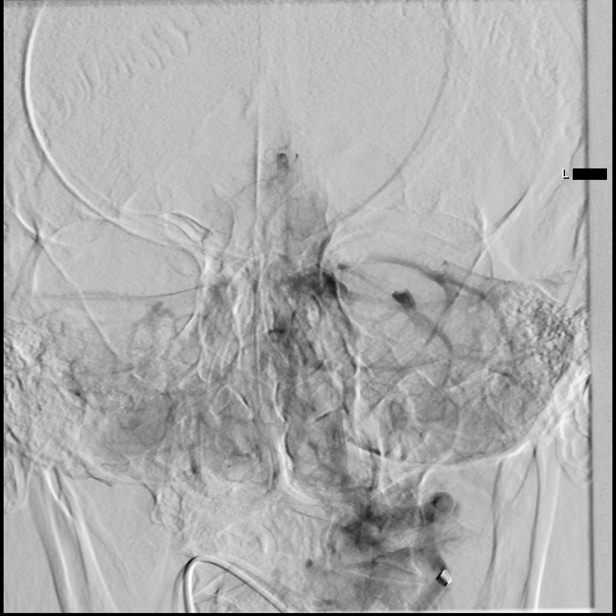

[Series 16: cerebral care 2 · 2 acquisitions, 1 frame shown (4 of 4)]
[im 1/2]
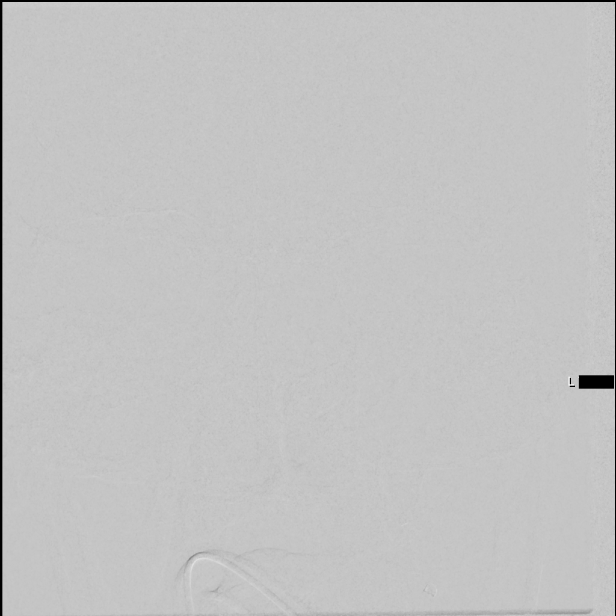

[Series 17: fl neuro n · 1 of 34 frames shown]
[frame 18/34]
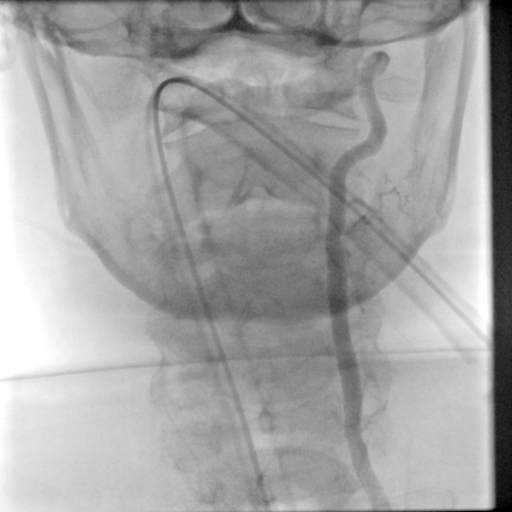

[Series 300: dr. (person_name) · 1 of 29 slices shown]
[im 17/29]
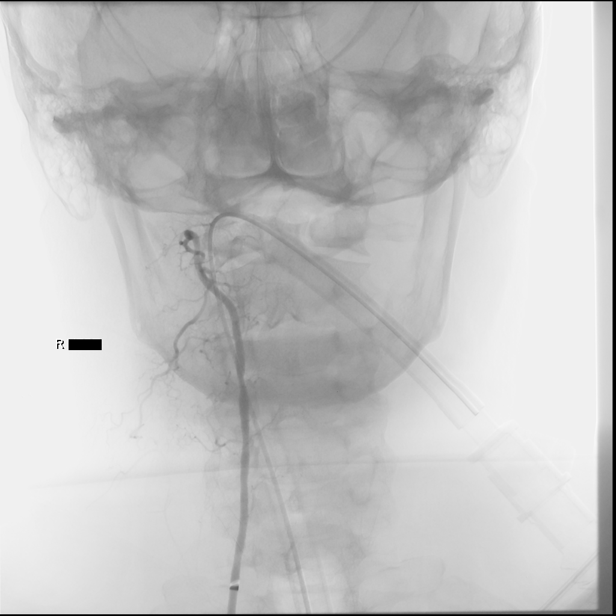

[11 of 24 positions shown; findings below may reference images not displayed]

MEDICATIONS:
Refer to anesthesia documentation

ANESTHESIA/SEDATION:
The procedure was performed under general anesthesia.

CONTRAST:  150 mL of Omnipaque 300 milligram/mL

FLUOROSCOPY TIME:  Fluoroscopy Time: 65 minutes 6 seconds (2,666
mGy).

COMPLICATIONS:
SIR LEVEL B - Normal therapy, includes overnight admission for
observation.
Maximal Sterile Barrier Technique was utilized including caps, mask,
sterile gowns, sterile gloves, sterile drape, hand hygiene and skin
antiseptic. A timeout was performed prior to the initiation of the
procedure.

The right groin was prepped and draped in the usual sterile fashion.
Using a micropuncture kit and the modified Seldinger technique,
access was gained to the right common femoral artery and an 8 French
sheath was placed. Real-time ultrasound guidance was utilized for
vascular access including the acquisition of a permanent ultrasound
image documenting patency of the accessed vessel.

Under fluoroscopy, a Zoom 88 guide catheter was navigated over a 6
RTOYOTA 2 catheter and a 0.035" Terumo Glidewire into the
aortic arch. The catheter was placed into the left subclavian artery
and then advanced into the left vertebral artery. The diagnostic
catheter was removed. Frontal and lateral angiograms of the head
were obtained.
FINDINGS: 1. Normal caliber of the right common femoral artery, adequate for
vascular access.
2. Occlusion of the left vertebral artery just distal to the PICA
origin.

PROCEDURE:
Under biplane roadmap, a zoom 71 aspiration catheter was navigated
into the left vertebral artery. The aspiration catheter connected to
an aspiration pump then advanced towards the level of occlusion in
the left vertebral artery. The catheter was noted to herniate into
the left PICA and, therefore, the catheter was retracted.

Frontal and lateral angiograms of the head were obtained in use at
biplane roadmap.

Then, coaxial navigation of a phenom 21 was performed over an
Aristotle 14 micro guidewire into the intracranial left vertebral
artery. The catheter was navigated over the wire into the
vertebrobasilar junction. Frontal lateral angiograms were obtained
via microcatheter contrast injection. Contrast was seen within
distal bilateral vertebral arteries. No progression of contrast into
the basilar artery noted. Further navigation of the catheter into
the basilar artery was obtained without difficulty. However, non
progression of the wire into the expected PCAs location was seen.
Magnified frontal and lateral angiograms were obtained via
microcatheter contrast injection. Contrast opacification was seen to
be along the anterior wall of the basilar artery with flame shaped
distally and no opacification of the posterior cerebral artery.
Findings are consistent with dissection.

Flat panel CT of the head was obtained and post processed in a
separate workstation with concurrent attending physician
supervision. Selected images were sent to PACS. No evidence of
contrast extravasation noted.

The microcatheter was retracted into the proximal intradural left
vertebral artery. Frontal and lateral angiograms of the neck were
obtained showing contrast opacification along the the false lumen in
the basilar artery.

Multiple attempts performed to gain access to the true lumen of the
basilar artery proved unsuccessful. The microcatheter was
subsequently withdrawn into the guide catheter.

Flat panel CT of the head was obtained and post processed in a
separate workstation with concurrent attending physician
supervision. Selected images were sent to PACS. No evidence of
contrast extravasation after removal of microcatheter from false
lumen.

Follow-up left vertebral artery angiograms showed stable findings.

The meantime the catheter was subsequently withdrawn.

A right common femoral artery angiogram showed access at the level
of the deep artery of the thigh with high femoral artery
bifurcation.

Under fluoroscopy, a Zoom 88 guide catheter was navigated over a 6
RTOYOTA 2 catheter and a 0.035" Terumo Glidewire into the
aortic arch. The catheter was placed into the right subclavian
artery and then advanced into the right vertebral artery. The
diagnostic catheter was removed. Frontal and lateral angiograms of
the head were obtained. The V2 segment of the right vertebral artery
ends at the distal V2 segment as a muscular branch with no
connection visible to the basilar artery.

The catheter was subsequently advanced over the wire and under
biplane roadmap into the right internal carotid artery. Frontal and
lateral angiograms of the head were obtained. There is brisk
contrast opacification of the right ACA and MCA vascular trees with
delayed opacification of the left PCA an SCA primarily via
leptomeningeal collaterals. No large posterior communicating artery
identified.

Under roadmap guidance, the catheter was then navigated into the
left internal carotid artery. Frontal and lateral angiograms of the
head were obtained. There is brisk contrast opacification of the
left ACA and MCA vascular tree with opacification of the basilar tip
and left PCA vascular tree seen via diminutive posterior
communicating artery and leptomeningeal collaterals.

Next, the catheter was again advanced into the left vertebral
artery. Frontal and lateral angiograms of the head were obtained and
used as biplane roadmap. Then, triaxial navigation of a phenom 21
was performed through a zoom 71 and over an Aristotle 14 micro
guidewire into the intracranial left vertebral artery. Attempt to
advance the microcatheter into the true lumen resulted in herniation
into the false lumen. The microcatheter was then left in the false
lumen as an attempt to block this pathway. Subsequently, a Prowler
microcatheter was navigated over a synchro 2 micro guidewire into
the left vertebral artery. Attempt to gain access to the true lumen
proved unsuccessful the catheter eventually herniating into the
false lumen. Both catheters were subsequently withdrawn. Control
angiograms showed persistent occlusion of the right vertebral artery
distal to the PICA. Flow to the PICA appear preserved. Interval
system was subsequently withdrawn.

Then, a Perclose pro style was utilized for access closure.
Immediate hemostasis was achieved.
IMPRESSION: Unsuccessful attempt to revascularize distal intracranial left
vertebral artery and basilar artery complicated by dissection.
Multiple attempts to gain access to the true lumen proved
unsuccessful and, therefore, procedure was aborted.

PLAN:
Patient will be transferred to MRI to evaluate stroke and vessel
patency. Further management should be decided after MRI in
conjunction with the neurology team. Family updated by telephone.

## 2021-02-08 IMAGING — MR MR MRA HEAD W/O CM
1 series · 18 of 48 positions shown · non-contrast
Comparison: Same day catheter arteriogram and CTA.

CLINICAL DATA: Stroke, follow up

EXAM:
MRI HEAD WITHOUT CONTRAST
MRA HEAD WITHOUT CONTRAST
TECHNIQUE: Multiplanar, multi-echo pulse sequences of the brain and surrounding
structures were acquired without intravenous contrast. Angiographic
images of the Circle of Willis were acquired using MRA technique
without intravenous contrast.

[Series 5: 3d cow · axial · 0.5mm · 0.43mm/px · z∈[-57,+24]mm · 18 of 172 slices shown]
[im 1/172]
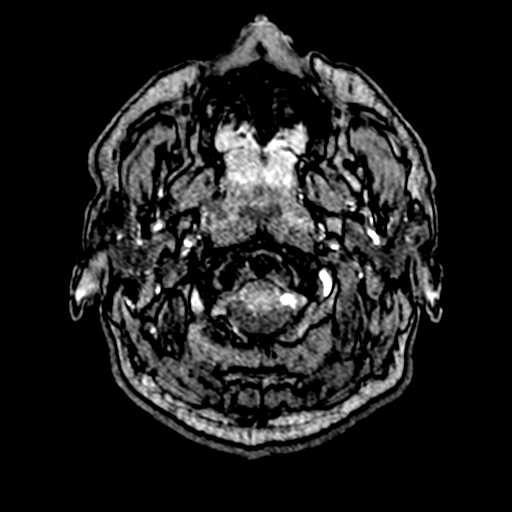
[im 4/172]
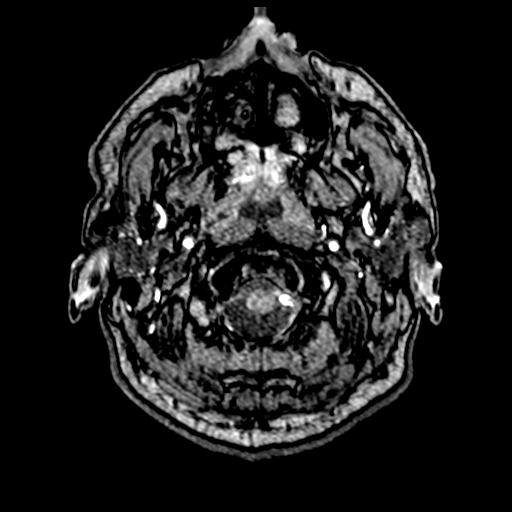
[im 8/172]
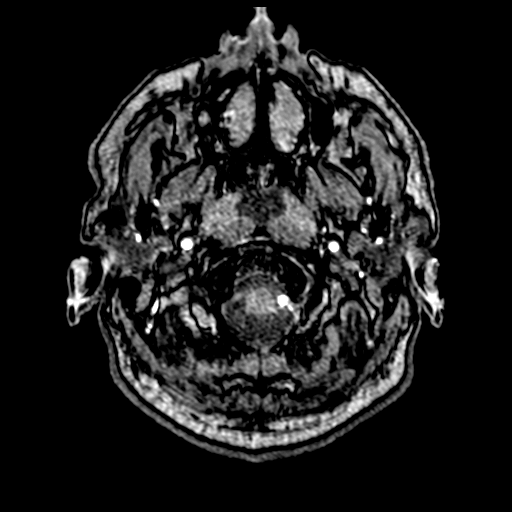
[im 11/172]
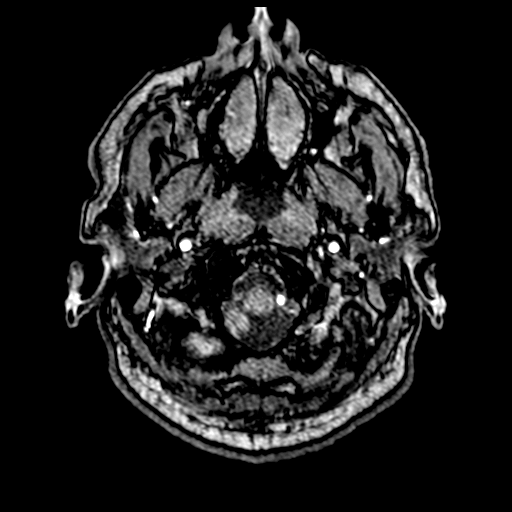
[im 15/172]
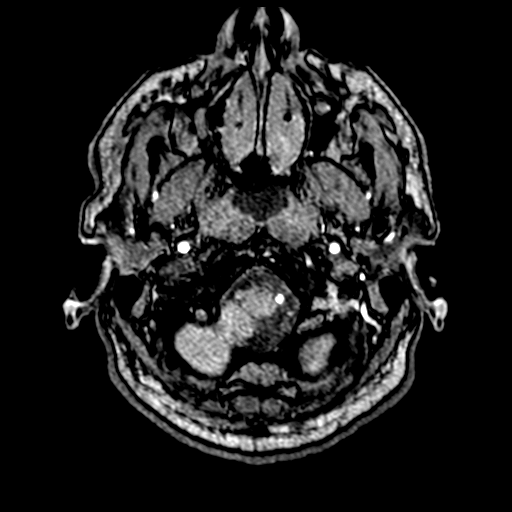
[im 19/172]
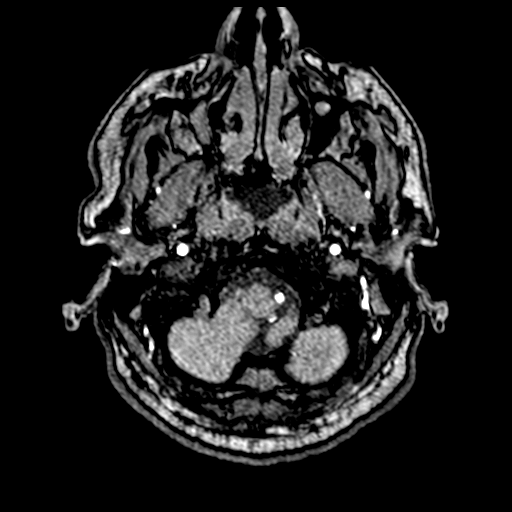
[im 22/172]
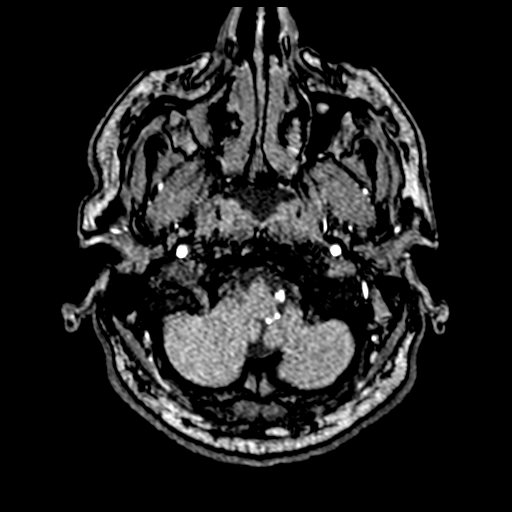
[im 26/172]
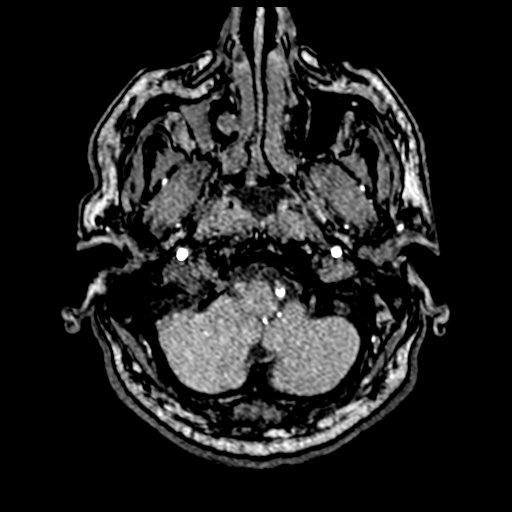
[im 30/172]
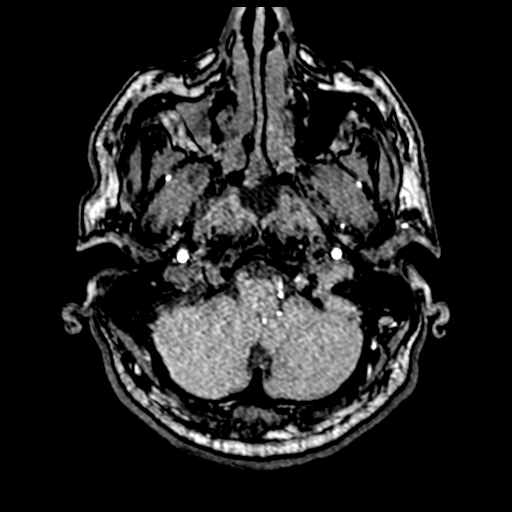
[im 33/172]
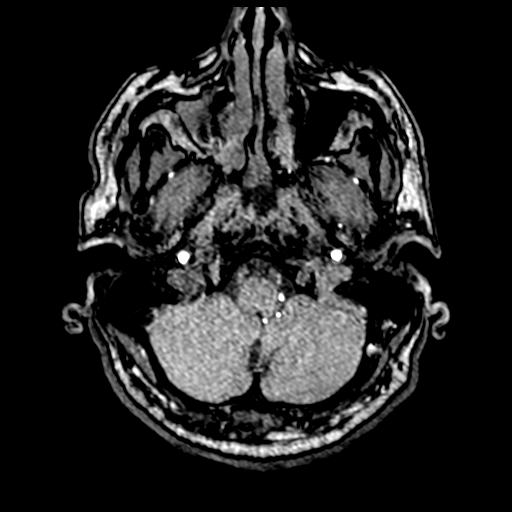
[im 55/172]
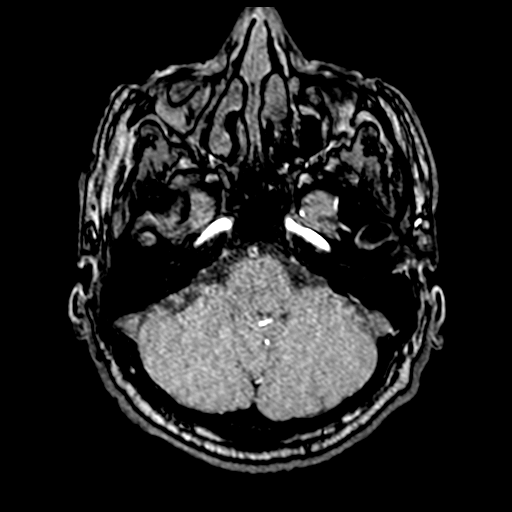
[im 77/172]
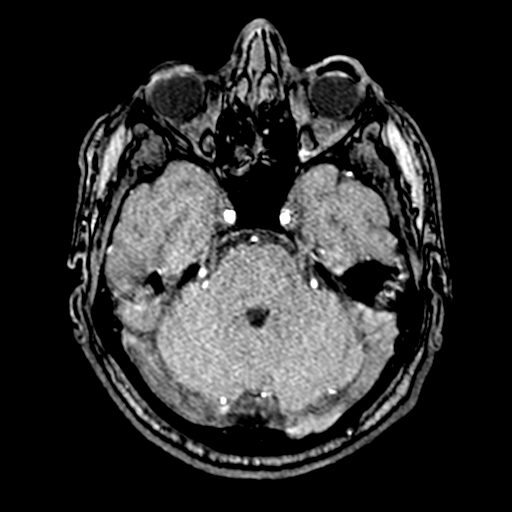
[im 88/172]
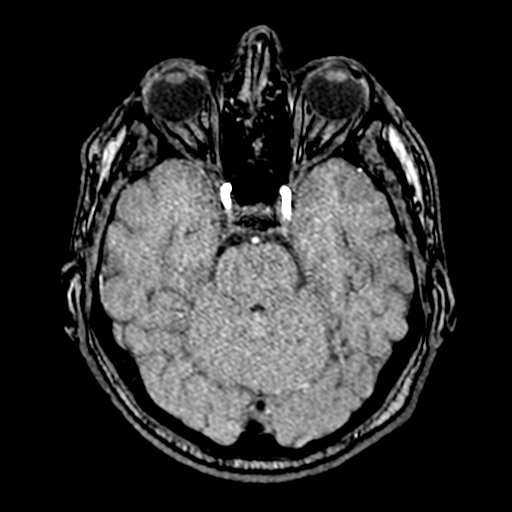
[im 99/172]
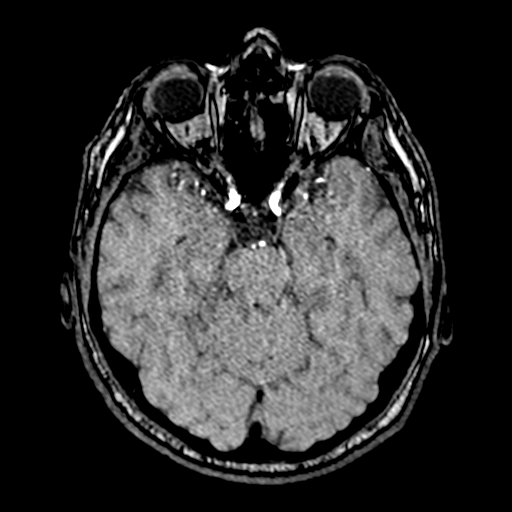
[im 121/172]
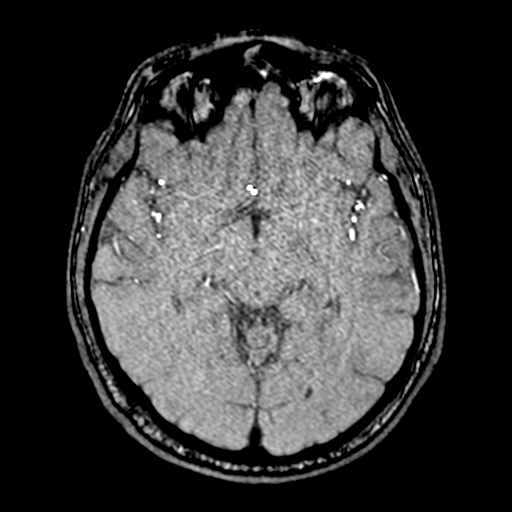
[im 142/172]
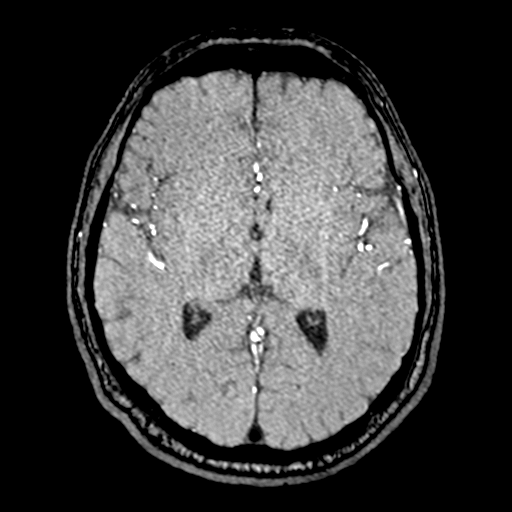
[im 146/172]
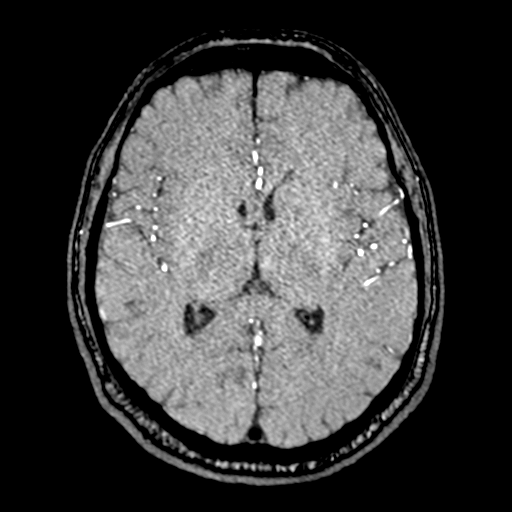
[im 164/172]
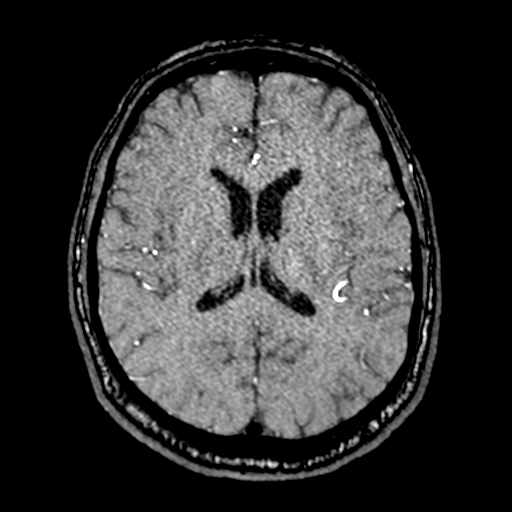

[18 of 48 positions shown; findings below may reference images not displayed]

FINDINGS: MRI HEAD FINDINGS

Brain: Acute infarcts within the left greater than right cerebellum,
pons and left thalamus. Mild edema without mass effect. Punctate
acute infarct in the high right parietal white matter (series 5,
image 90). No hydrocephalus, mass lesion, midline shift, or
extra-axial fluid collection

Vascular: Arterial vasculature is assessed below. T2 hyperintensity
within the left transverse and sigmoid sinuses likely represents
slow flow given these sinuses were patent on same day CTA.

Skull and upper cervical spine: Normal marrow signal.

Sinuses/Orbits: Moderate paranasal sinus mucosal thickening.
Unremarkable orbits.

Other: No mastoid effusions.

MRA HEAD FINDINGS

Anterior circulation: Bilateral intracranial ICAs are patent.
Bilateral M1 MCAs and ACAs are patent without proximal high-grade
stenosis. High-grade right M2 MCA stenosis.

Posterior circulation: No flow related signal within the right
intradural vertebral artery, compatible with known occlusion. The
proximal left intradural vertebral artery demonstrates flow related
signal up until the PICA origin. The left PICA appears patent.
Distal to the PICA origin there is minimal flow related signal,
which may be within a false lumen given findings on recent catheter
arteriogram and CTA. No flow related signal within the proximal
basilar artery, compatible with occlusion. Minimal flow related
signal in the distal basilar artery. Focus of low intensity signal
within the basilar tip may represent stagnant contrast from recent
catheter arteriogram. Small bilateral posterior communicating
arteries and posterior cerebral arteries. Poor flow related signal
in the distal PCAs bilaterally.
IMPRESSION: MRI:

1. Acute infarcts within the left greater than right cerebellum,
pons, and left thalamus.Mild edema without mass effect.
2. Additional punctate acute infarct in the high right parietal
white matter.

MRA:

1. Findings compatible with bilateral intradural vertebral artery
and basilar artery occlusion, as detailed above.
2. Small and poorly opacified PCAs bilaterally with small bilateral
posterior communicating arteries.
3. Please see recent catheter arteriogram for additional details.

These results will be called to the ordering clinician or
representative by the Radiologist Assistant, and communication
documented in the PACS or [REDACTED].

## 2021-02-08 IMAGING — MR MR HEAD W/O CM
12 of 13 series · 44 of 48 positions shown · non-contrast
Comparison: Same day catheter arteriogram and CTA.

CLINICAL DATA: Stroke, follow up

EXAM:
MRI HEAD WITHOUT CONTRAST
MRA HEAD WITHOUT CONTRAST
TECHNIQUE: Multiplanar, multi-echo pulse sequences of the brain and surrounding
structures were acquired without intravenous contrast. Angiographic
images of the Circle of Willis were acquired using MRA technique
without intravenous contrast.

[Series 5: DWI · axial · 3.0mm · 0.88mm/px · z∈[-64,+87]mm · 8 of 104 slices shown (1 of 4)]
[im 1/104]
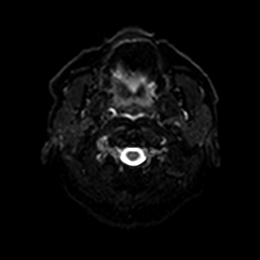
[im 15/104]
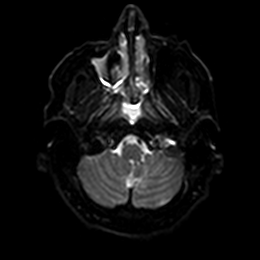
[im 30/104]
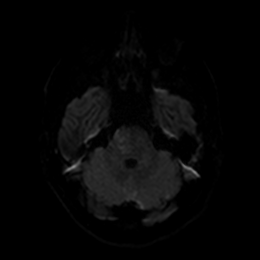
[im 45/104]
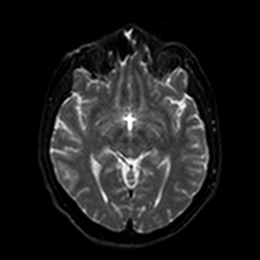
[im 59/104]
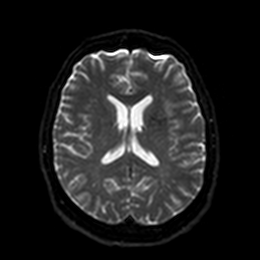
[im 74/104]
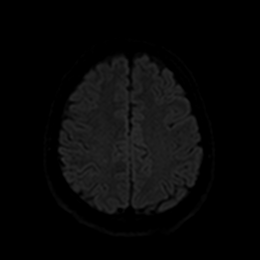
[im 89/104]
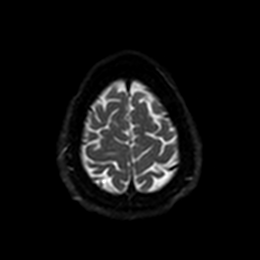
[im 104/104]
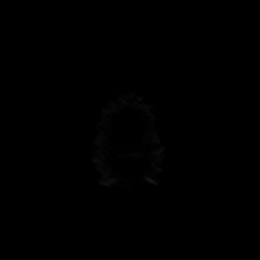

[Series 6: DWI · axial · 3.0mm · 0.88mm/px · z∈[-64,+87]mm · 4 of 52 slices shown (2 of 4)]
[im 1/52]
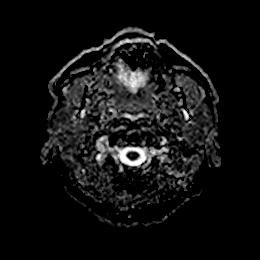
[im 18/52]
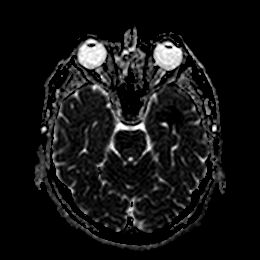
[im 35/52]
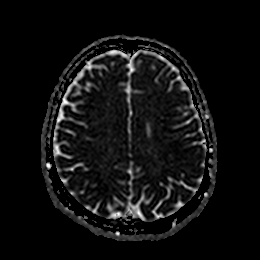
[im 52/52]
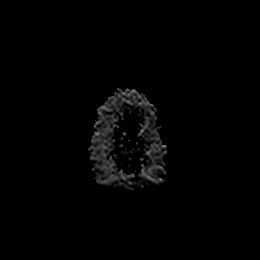

[Series 7: DWI · coronal · 4.0mm · 0.88mm/px · 5 of 72 slices shown (3 of 4)]
[im 1/72]
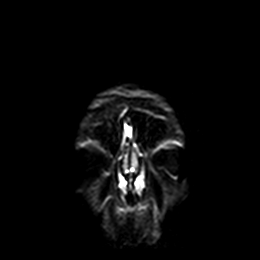
[im 18/72]
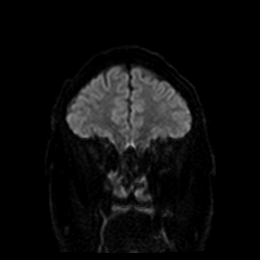
[im 36/72]
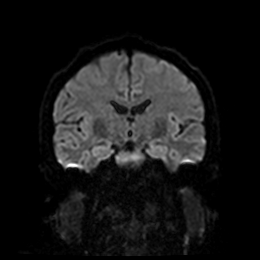
[im 54/72]
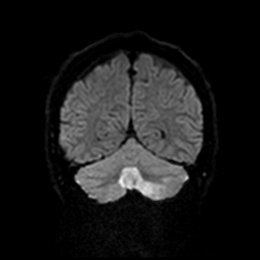
[im 72/72]
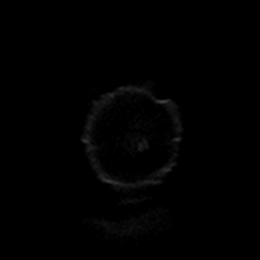

[Series 8: DWI · coronal · 4.0mm · 0.88mm/px · 3 of 36 slices shown (4 of 4)]
[im 1/36]
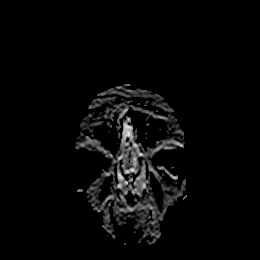
[im 18/36]
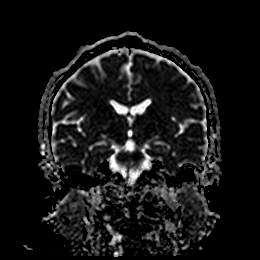
[im 36/36]
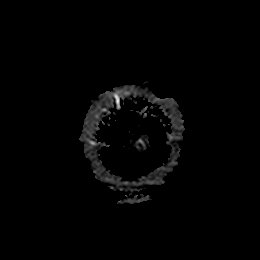

[Series 9: T1 · sagittal · 5.0mm · 0.75mm/px · 2 of 23 slices shown]
[im 1/23]
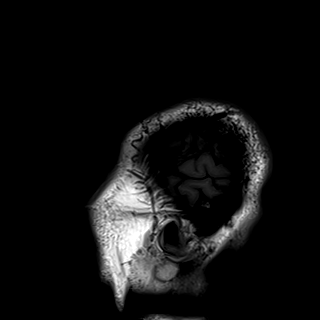
[im 23/23]
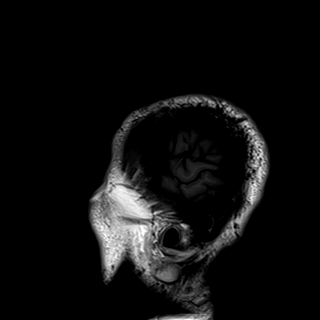

[Series 10: T2 · axial · 5.0mm · 0.72mm/px · z∈[-67,+76]mm · 2 of 25 slices shown (1 of 2)]
[im 1/25]
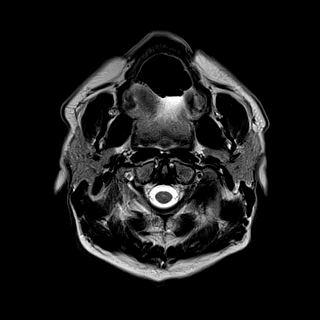
[im 25/25]
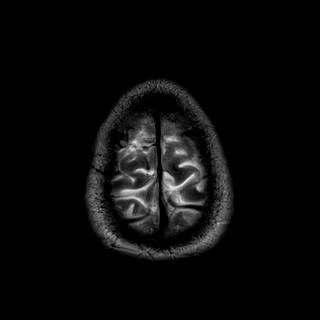

[Series 11: FLAIR · axial · 5.0mm · 0.45mm/px · z∈[-67,+76]mm · 2 of 25 slices shown]
[im 1/25]
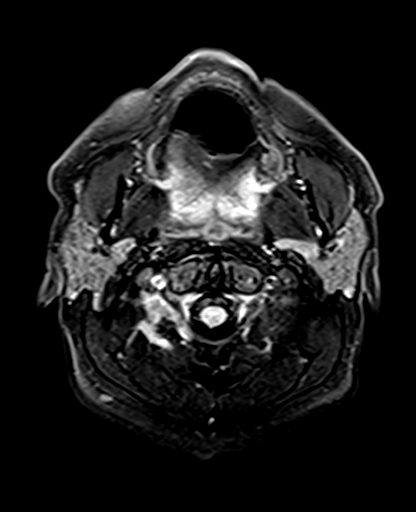
[im 25/25]
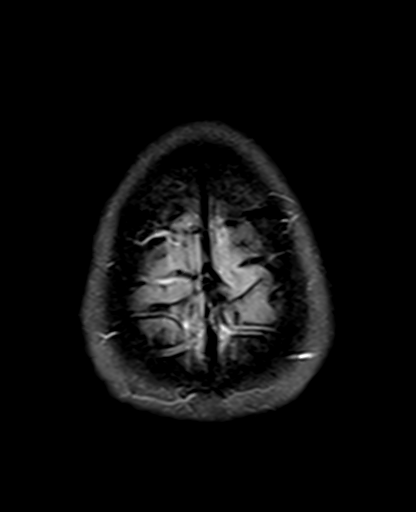

[Series 12: mag_images · axial · 3.0mm · 0.90mm/px · z∈[-68,+108]mm · 4 of 60 slices shown]
[im 1/60]
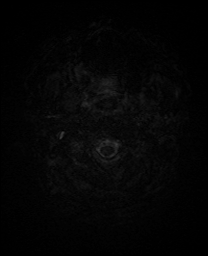
[im 20/60]
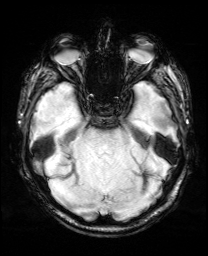
[im 40/60]
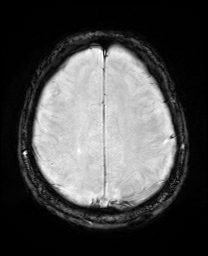
[im 60/60]
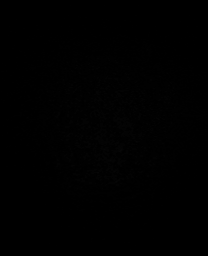

[Series 13: pha_images · axial · 3.0mm · 0.90mm/px · z∈[-68,+105]mm · 4 of 58 slices shown]
[im 1/58]
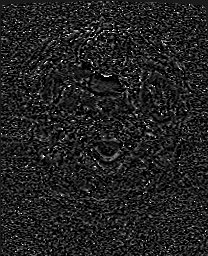
[im 20/58]
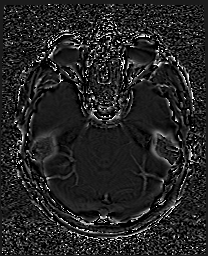
[im 39/58]
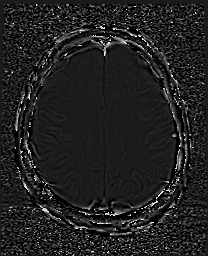
[im 58/58]
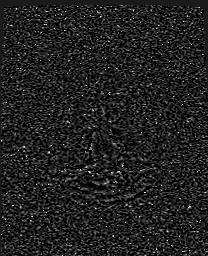

[Series 14: swi_images · axial · 3.0mm · 0.90mm/px · z∈[-68,+108]mm · 4 of 60 slices shown]
[im 1/60]
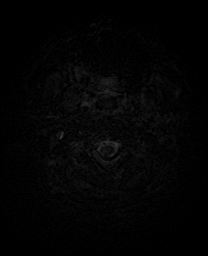
[im 20/60]
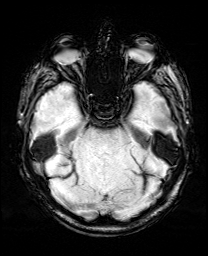
[im 40/60]
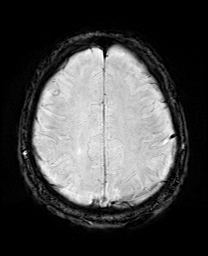
[im 60/60]
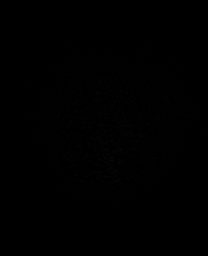

[Series 15: mip_images(sw) · axial · 24.0mm · 0.90mm/px · z∈[-57,+98]mm · 4 of 53 slices shown]
[im 1/53]
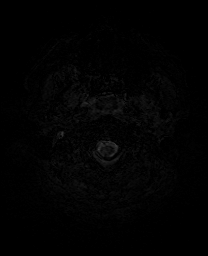
[im 18/53]
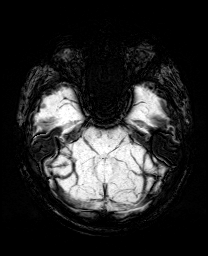
[im 35/53]
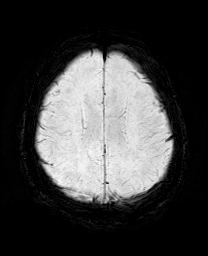
[im 53/53]
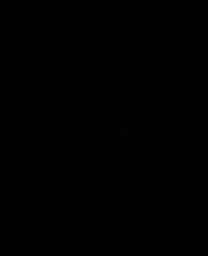

[Series 17: T2 · coronal · 5.0mm · 0.34mm/px · 2 of 29 slices shown (2 of 2)]
[im 1/29]
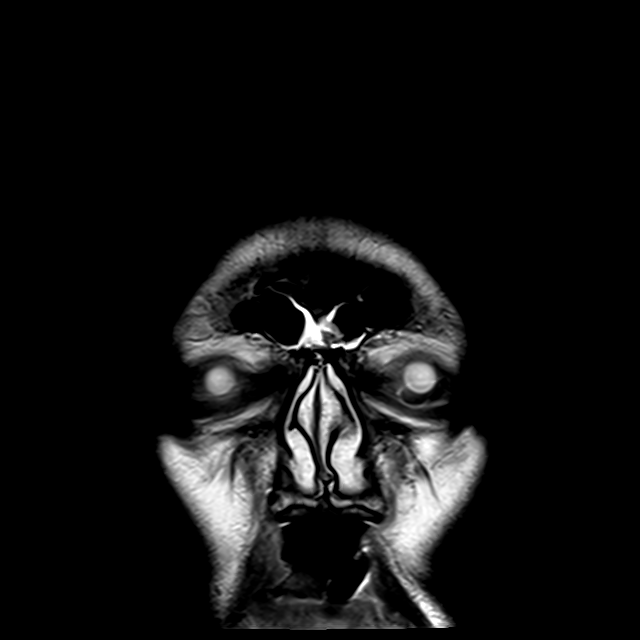
[im 29/29]
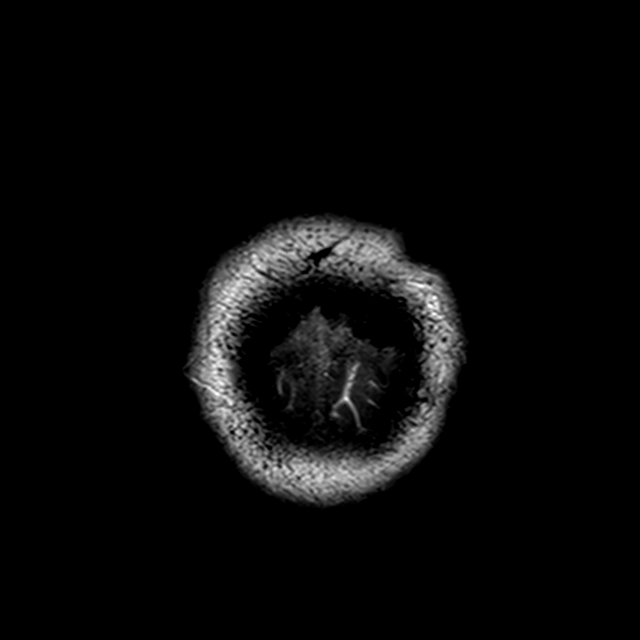

[44 of 48 positions shown; findings below may reference images not displayed]

FINDINGS: MRI HEAD FINDINGS

Brain: Acute infarcts within the left greater than right cerebellum,
pons and left thalamus. Mild edema without mass effect. Punctate
acute infarct in the high right parietal white matter (series 5,
image 90). No hydrocephalus, mass lesion, midline shift, or
extra-axial fluid collection

Vascular: Arterial vasculature is assessed below. T2 hyperintensity
within the left transverse and sigmoid sinuses likely represents
slow flow given these sinuses were patent on same day CTA.

Skull and upper cervical spine: Normal marrow signal.

Sinuses/Orbits: Moderate paranasal sinus mucosal thickening.
Unremarkable orbits.

Other: No mastoid effusions.

MRA HEAD FINDINGS

Anterior circulation: Bilateral intracranial ICAs are patent.
Bilateral M1 MCAs and ACAs are patent without proximal high-grade
stenosis. High-grade right M2 MCA stenosis.

Posterior circulation: No flow related signal within the right
intradural vertebral artery, compatible with known occlusion. The
proximal left intradural vertebral artery demonstrates flow related
signal up until the PICA origin. The left PICA appears patent.
Distal to the PICA origin there is minimal flow related signal,
which may be within a false lumen given findings on recent catheter
arteriogram and CTA. No flow related signal within the proximal
basilar artery, compatible with occlusion. Minimal flow related
signal in the distal basilar artery. Focus of low intensity signal
within the basilar tip may represent stagnant contrast from recent
catheter arteriogram. Small bilateral posterior communicating
arteries and posterior cerebral arteries. Poor flow related signal
in the distal PCAs bilaterally.
IMPRESSION: MRI:

1. Acute infarcts within the left greater than right cerebellum,
pons, and left thalamus.Mild edema without mass effect.
2. Additional punctate acute infarct in the high right parietal
white matter.

MRA:

1. Findings compatible with bilateral intradural vertebral artery
and basilar artery occlusion, as detailed above.
2. Small and poorly opacified PCAs bilaterally with small bilateral
posterior communicating arteries.
3. Please see recent catheter arteriogram for additional details.

These results will be called to the ordering clinician or
representative by the Radiologist Assistant, and communication
documented in the PACS or [REDACTED].

## 2021-02-08 IMAGING — DX DG ABD PORTABLE 1V
1 series · 1 of 1 positions shown · non-contrast
Comparison: None.

CLINICAL DATA: Enteric catheter placement

EXAM:
PORTABLE ABDOMEN - 1 VIEW

[abdomen kub]
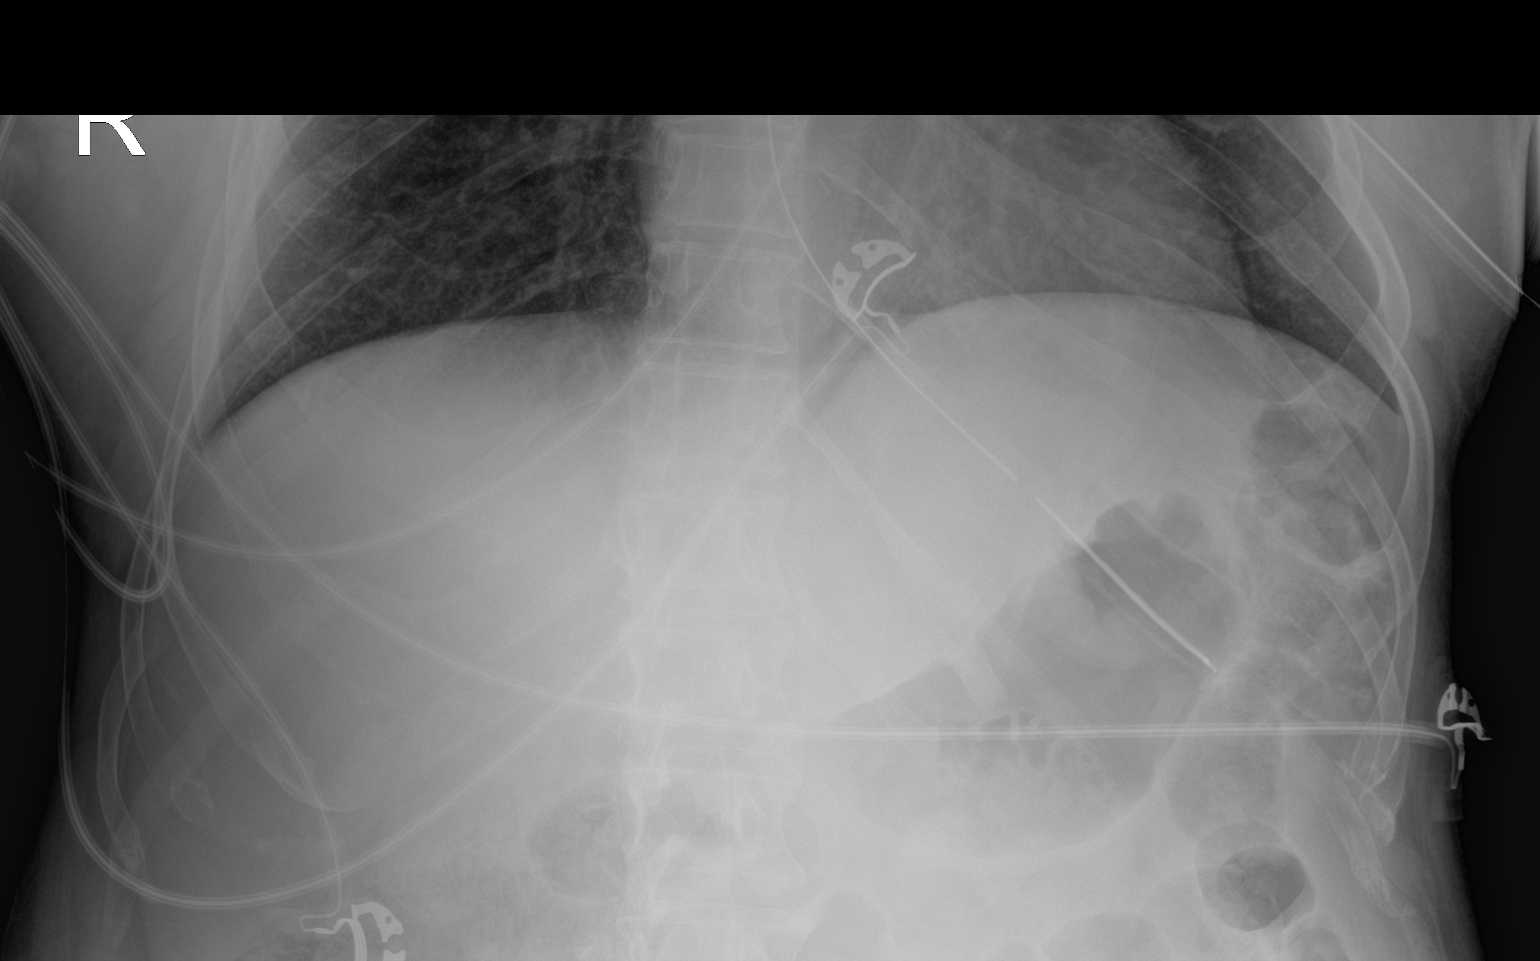

[1 of 1 positions shown; findings below may reference images not displayed]

FINDINGS: Frontal view of the lower chest and upper abdomen demonstrates
enteric catheter tip and side port projecting over the gastric
fundus. Lung bases are clear. Bowel gas pattern is unremarkable.
IMPRESSION: 1. Enteric catheter tip projecting over the gastric fundus.

## 2021-02-08 IMAGING — DX DG CHEST 1V PORT
1 series · 1 of 1 positions shown · non-contrast
Comparison: [DATE]

CLINICAL DATA: Intubated

EXAM:
PORTABLE CHEST 1 VIEW

[chest ap]
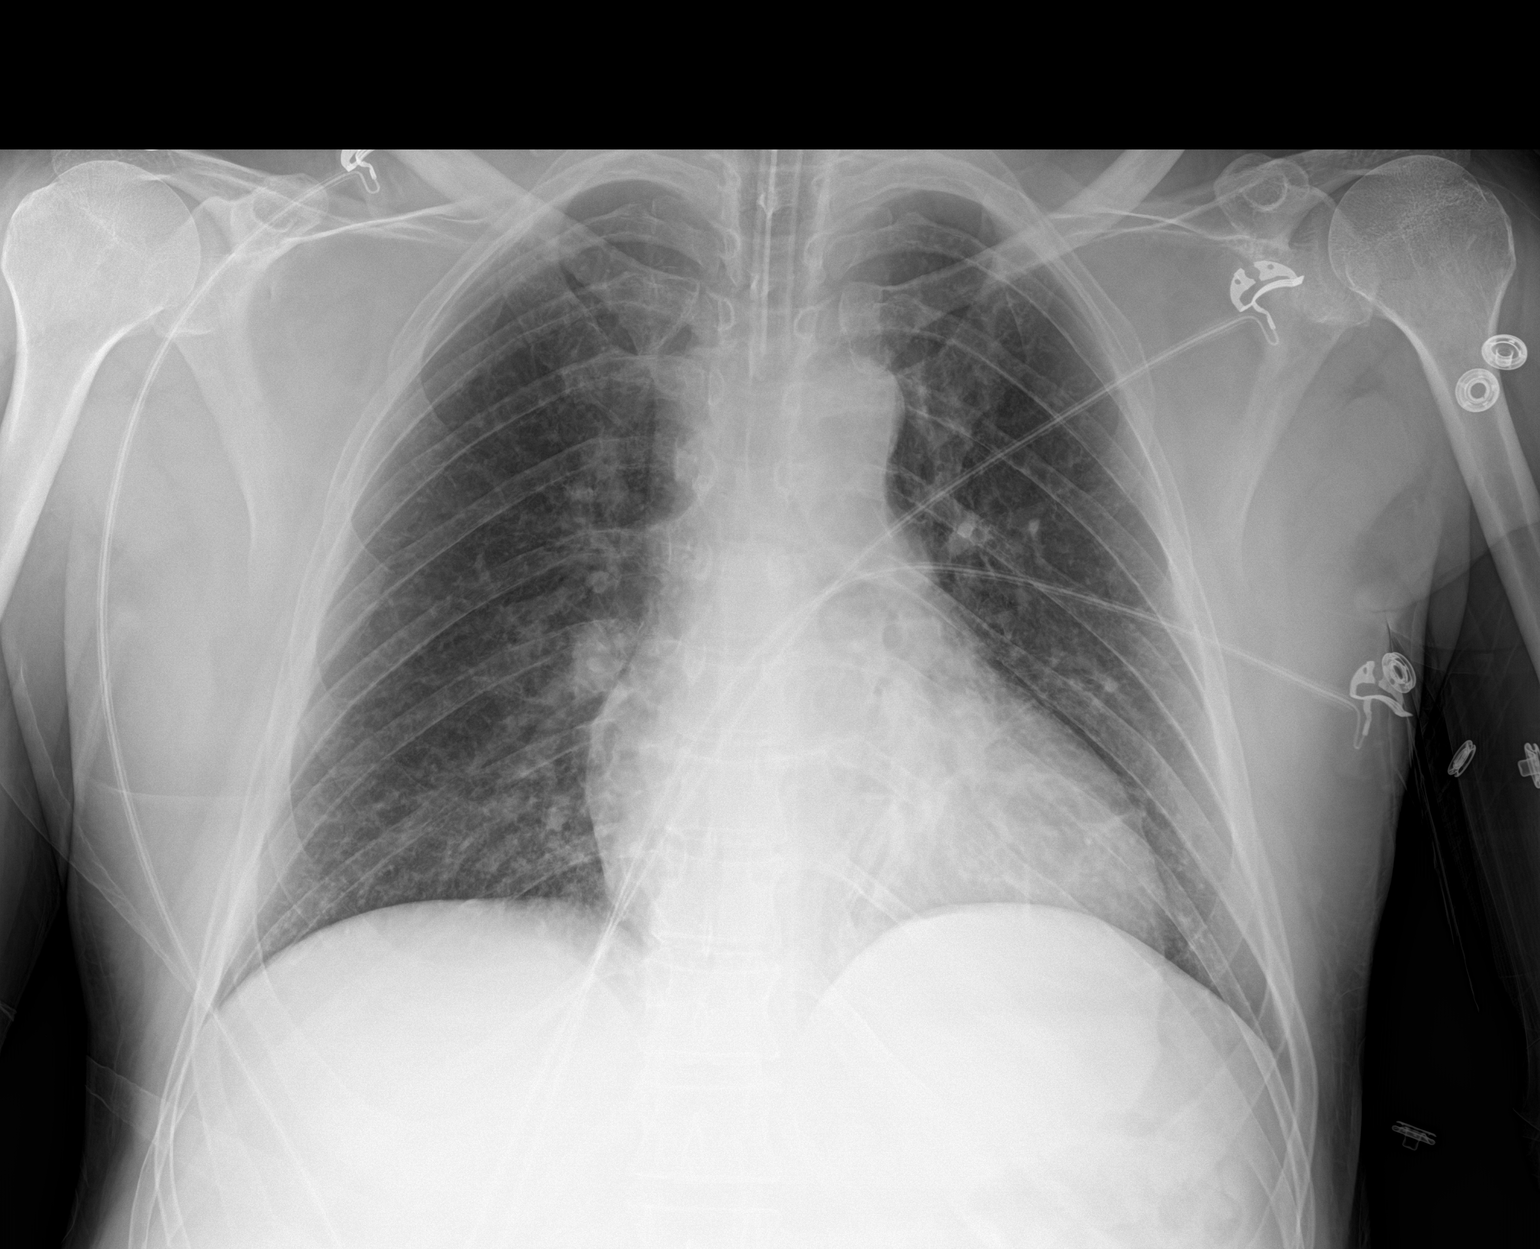

[1 of 1 positions shown; findings below may reference images not displayed]

FINDINGS: Single frontal view of the chest demonstrates endotracheal tube
overlying tracheal air column tip just below thoracic inlet. Cardiac
silhouette is unremarkable. No airspace disease, effusion, or
pneumothorax. Minimal hypoventilatory changes left base. No acute
bony abnormalities.
IMPRESSION: 1. No complication after intubation.  No acute process.

## 2021-02-08 SURGERY — IR WITH ANESTHESIA
Anesthesia: General

## 2021-02-08 MED ORDER — DOCUSATE SODIUM 50 MG/5ML PO LIQD
100.0000 mg | Freq: Two times a day (BID) | ORAL | Status: DC
  Administered 2021-02-08 – 2021-02-09 (×2): 100 mg
  Filled 2021-02-08 (×2): qty 10

## 2021-02-08 MED ORDER — DEXAMETHASONE SODIUM PHOSPHATE 10 MG/ML IJ SOLN
INTRAMUSCULAR | Status: DC | PRN
  Administered 2021-02-08: 5 mg via INTRAVENOUS

## 2021-02-08 MED ORDER — CLEVIDIPINE BUTYRATE 0.5 MG/ML IV EMUL
INTRAVENOUS | Status: AC
  Filled 2021-02-08: qty 50

## 2021-02-08 MED ORDER — IOHEXOL 300 MG/ML  SOLN
100.0000 mL | Freq: Once | INTRAMUSCULAR | Status: AC | PRN
  Administered 2021-02-08: 50 mL via INTRA_ARTERIAL

## 2021-02-08 MED ORDER — SODIUM CHLORIDE 0.9 % IV SOLN
INTRAVENOUS | Status: DC
  Filled 2021-02-08: qty 1000

## 2021-02-08 MED ORDER — SENNOSIDES-DOCUSATE SODIUM 8.6-50 MG PO TABS: 1.0000 | ORAL_TABLET | Freq: Every evening | ORAL | Status: DC | PRN

## 2021-02-08 MED ORDER — PROPOFOL 10 MG/ML IV BOLUS
INTRAVENOUS | Status: AC
  Filled 2021-02-08: qty 20

## 2021-02-08 MED ORDER — CHLORHEXIDINE GLUCONATE 0.12% ORAL RINSE (MEDLINE KIT)
15.0000 mL | Freq: Two times a day (BID) | OROMUCOSAL | Status: DC
  Administered 2021-02-08 – 2021-02-13 (×10): 15 mL via OROMUCOSAL

## 2021-02-08 MED ORDER — ACETAMINOPHEN 160 MG/5ML PO SOLN: 650.0000 mg | ORAL | Status: DC | PRN

## 2021-02-08 MED ORDER — PHENYLEPHRINE HCL-NACL 20-0.9 MG/250ML-% IV SOLN
INTRAVENOUS | Status: DC | PRN
  Administered 2021-02-08: 20 ug/min via INTRAVENOUS

## 2021-02-08 MED ORDER — PROPOFOL 500 MG/50ML IV EMUL
INTRAVENOUS | Status: DC | PRN
  Administered 2021-02-08: 62.5 ug/kg/min via INTRAVENOUS

## 2021-02-08 MED ORDER — ORAL CARE MOUTH RINSE
15.0000 mL | OROMUCOSAL | Status: DC
  Administered 2021-02-08 – 2021-02-13 (×45): 15 mL via OROMUCOSAL

## 2021-02-08 MED ORDER — FENTANYL CITRATE (PF) 250 MCG/5ML IJ SOLN
INTRAMUSCULAR | Status: DC | PRN
  Administered 2021-02-08: 100 ug via INTRAVENOUS
  Administered 2021-02-08: 50 ug via INTRAVENOUS
  Administered 2021-02-08: 25 ug via INTRAVENOUS

## 2021-02-08 MED ORDER — SODIUM CHLORIDE 0.9 % IV SOLN: INTRAVENOUS | Status: DC | PRN

## 2021-02-08 MED ORDER — ACETAMINOPHEN 325 MG PO TABS: 650.0000 mg | ORAL_TABLET | ORAL | Status: DC | PRN

## 2021-02-08 MED ORDER — CLEVIDIPINE BUTYRATE 0.5 MG/ML IV EMUL
0.0000 mg/h | INTRAVENOUS | Status: DC
  Administered 2021-02-08 (×3): 7 mg/h via INTRAVENOUS
  Administered 2021-02-08: 13:00:00 2 mg/h via INTRAVENOUS
  Administered 2021-02-09 (×2): 11 mg/h via INTRAVENOUS
  Administered 2021-02-09 (×2): 7 mg/h via INTRAVENOUS
  Administered 2021-02-09: 23:00:00 17 mg/h via INTRAVENOUS
  Administered 2021-02-09: 15:00:00 9 mg/h via INTRAVENOUS
  Administered 2021-02-10: 7 mg/h via INTRAVENOUS
  Administered 2021-02-10 (×3): 21 mg/h via INTRAVENOUS
  Administered 2021-02-10: 17 mg/h via INTRAVENOUS
  Filled 2021-02-08: qty 100
  Filled 2021-02-08 (×3): qty 50
  Filled 2021-02-08 (×2): qty 100
  Filled 2021-02-08 (×2): qty 50
  Filled 2021-02-08 (×4): qty 100
  Filled 2021-02-08: qty 50
  Filled 2021-02-08 (×3): qty 100

## 2021-02-08 MED ORDER — STROKE: EARLY STAGES OF RECOVERY BOOK
Freq: Once | Status: AC
  Filled 2021-02-08: qty 1

## 2021-02-08 MED ORDER — FENTANYL CITRATE (PF) 250 MCG/5ML IJ SOLN
INTRAMUSCULAR | Status: AC
  Filled 2021-02-08: qty 5

## 2021-02-08 MED ORDER — ROCURONIUM BROMIDE 10 MG/ML (PF) SYRINGE
PREFILLED_SYRINGE | INTRAVENOUS | Status: DC | PRN
  Administered 2021-02-08: 50 mg via INTRAVENOUS
  Administered 2021-02-08: 60 mg via INTRAVENOUS
  Administered 2021-02-08 (×2): 20 mg via INTRAVENOUS

## 2021-02-08 MED ORDER — ACETAMINOPHEN 650 MG RE SUPP
650.0000 mg | RECTAL | Status: DC | PRN
  Administered 2021-02-10 – 2021-02-12 (×3): 650 mg via RECTAL
  Filled 2021-02-08 (×4): qty 1

## 2021-02-08 MED ORDER — ACETAMINOPHEN 160 MG/5ML PO SOLN
650.0000 mg | ORAL | Status: DC | PRN
  Administered 2021-02-12 (×2): 650 mg
  Filled 2021-02-08 (×3): qty 20.3

## 2021-02-08 MED ORDER — ACETAMINOPHEN 650 MG RE SUPP: 650.0000 mg | RECTAL | Status: DC | PRN

## 2021-02-08 MED ORDER — SUCCINYLCHOLINE CHLORIDE 200 MG/10ML IV SOSY
PREFILLED_SYRINGE | INTRAVENOUS | Status: DC | PRN
  Administered 2021-02-08: 140 mg via INTRAVENOUS

## 2021-02-08 MED ORDER — LIDOCAINE HCL 1 % IJ SOLN
INTRAMUSCULAR | Status: AC
  Filled 2021-02-08: qty 20

## 2021-02-08 MED ORDER — POLYETHYLENE GLYCOL 3350 17 G PO PACK
17.0000 g | PACK | Freq: Every day | ORAL | Status: DC
  Administered 2021-02-09: 10:00:00 17 g
  Filled 2021-02-08: qty 1

## 2021-02-08 MED ORDER — PROPOFOL 10 MG/ML IV BOLUS
INTRAVENOUS | Status: DC | PRN
  Administered 2021-02-08: 160 mg via INTRAVENOUS
  Administered 2021-02-08: 20 mg via INTRAVENOUS
  Administered 2021-02-08: 40 mg via INTRAVENOUS

## 2021-02-08 MED ORDER — IOHEXOL 300 MG/ML  SOLN
100.0000 mL | Freq: Once | INTRAMUSCULAR | Status: DC | PRN
  Administered 2021-02-08: 50 mL via INTRA_ARTERIAL

## 2021-02-08 MED ORDER — PROPOFOL 1000 MG/100ML IV EMUL
0.0000 ug/kg/min | INTRAVENOUS | Status: DC
  Administered 2021-02-08 – 2021-02-09 (×5): 50 ug/kg/min via INTRAVENOUS
  Filled 2021-02-08: qty 100
  Filled 2021-02-08: qty 200
  Filled 2021-02-08 (×2): qty 100

## 2021-02-08 NOTE — Progress Notes (Signed)
Unable to assess NIH. Pt remains intubated and sedated, under the care of anesthesia

## 2021-02-08 NOTE — Anesthesia Procedure Notes (Addendum)
Procedure Name: Intubation Date/Time: 02/10/2021 8:56 AM Performed by: Thelma Comp, CRNA Pre-anesthesia Checklist: Patient identified, Emergency Drugs available, Suction available and Patient being monitored Patient Re-evaluated:Patient Re-evaluated prior to induction Oxygen Delivery Method: Circle system utilized Preoxygenation: Pre-oxygenation with 100% oxygen Induction Type: IV induction, Rapid sequence and Cricoid Pressure applied Laryngoscope Size: Mac and 4 Grade View: Grade I Tube type: Oral Tube size: 7.5 mm Number of attempts: 1 Airway Equipment and Method: Stylet Placement Confirmation: ETT inserted through vocal cords under direct vision, positive ETCO2 and breath sounds checked- equal and bilateral Secured at: 22 cm Tube secured with: Tape Dental Injury: Teeth and Oropharynx as per pre-operative assessment  Comments: Dentures removed

## 2021-02-08 NOTE — Code Documentation (Signed)
Stroke Response Nurse Documentation Code Documentation  Richard Oneal is a 50 y.o. male arriving to [facility] IR via [facility] on 02/07/2021 with past medical hx of high cholesterol. Code stroke was activated by [facility] ED after patient was noted to have slurred speech and slight facial droop. Patient was LKW last night around 2000 per sister. He went to the [place] ED last night after having a headache all day. During work-up, [facility] Salvia noted Large Vessel Occlusion and called for emergent transfer.    Stroke team at the bedside on patient arrival. Patient cleared for IR by Dr. Jeraldine Loots. Patient to IR with team. NIHSS 2, see documentation for details and code stroke times. Patient with left facial droop and dysarthria  on exam. Patient is not a candidate for IV Thrombolytic due to being outside the window.   Patient consented by Dr. Tommie Sams and Dr. Wilford Corner at the bedside.   Care/Plan: Admit to ICU.   Bedside handoff with Alphonzo Lemmings, IR RN  Lucila Maine  Stroke Response RN

## 2021-02-08 NOTE — Progress Notes (Signed)
Report given at bedside in PACU to [facility]. Groin level 0, unremarkable

## 2021-02-08 NOTE — Anesthesia Postprocedure Evaluation (Signed)
Anesthesia Post Note  Patient: Richard Oneal  Procedure(s) Performed: IR WITH ANESTHESIA     Patient location during evaluation: SICU Anesthesia Type: General Level of consciousness: sedated Pain management: pain level controlled Vital Signs Assessment: post-procedure vital signs reviewed and stable Respiratory status: patient remains intubated per anesthesia plan Cardiovascular status: stable Postop Assessment: no apparent nausea or vomiting Anesthetic complications: no   No notable events documented.  Last Vitals:  Vitals:   02/17/2021 1230 02/15/2021 1245  BP: (!) 193/117 (!) 172/95  Pulse: 76 98  Resp: 16 20  Temp:    SpO2: 100% 100%    Last Pain: There were no vitals filed for this visit.               Javaria Knapke

## 2021-02-08 NOTE — Progress Notes (Signed)
Departed MRI. Transporting to PACU with CRNAx2, RT and this RN

## 2021-02-08 NOTE — Procedures (Addendum)
INTERVENTIONAL NEURORADIOLOGY BRIEF POSTPROCEDURE NOTE  Diagnostic cerebral angiogram and attempted posterior circulation revascularization   Attending: Dr. Baldemar Lenis   Assistant: None.   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    Access site: Right common femoral artery, 8 [place].    Access closure: Perclose pro style.    Anesthesia: General anesthesia.    Medication used: Refer to anesthesia documentation..   Complications: Vertebrobasilar dissection.    Estimated blood loss: Minimal.   Specimen: None.    Findings: Occlusion of the left vertebral artery just distal to the left PICA origin.  Microcatheter advanced over the wire through occlusion into the basilar artery.  However, microcatheter contrast injection revealed trajectory through the a false lumen.  Multiple attempts performed to gain access to the true lumen proved unsuccessful.    Right vertebral artery angiogram showed V2 segment ending as a muscular branch without access to the basilar artery.    Bilateral internal carotid artery angiograms showed contrast opacification of the bilateral PCA vascular tree and basilar tip.  No large posterior communicating artery identified to serve as an alternative access to the basilar artery.  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    Due to high risk of dissects flap perforation and inability to gain access to the true lumen, procedure was aborted.   Plan to go to MRI with further management pending MRI results.  Sister, Mrs. Percell Boston, update by telephone.

## 2021-02-08 NOTE — Transfer of Care (Signed)
Immediate Anesthesia Transfer of Care Note  Patient: Richard Oneal  Procedure(s) Performed: IR WITH ANESTHESIA  Patient Location: PACU  Anesthesia Type:General  Level of Consciousness: Patient remains intubated per anesthesia plan  Airway & Oxygen Therapy: Patient remains intubated per anesthesia plan and Patient placed on Ventilator (see vital sign flow sheet for setting)  Post-op Assessment: Report given to RN and Post -op Vital signs reviewed and stable  Post vital signs: Reviewed and stable  Last Vitals:  Vitals Value Taken Time  BP 146/98 02/05/2021 1200  Temp    Pulse 81 02/26/2021 1208  Resp 16 02/20/2021 1208  SpO2 100 % 02/20/2021 1208  Vitals shown include unvalidated device data.  Last Pain: There were no vitals filed for this visit.       Complications: No notable events documented.

## 2021-02-08 NOTE — Progress Notes (Signed)
Ventilator patient transported from PACU to 2M14 without any complications.

## 2021-02-08 NOTE — Progress Notes (Signed)
Departed IR. Transported pt to MRI with CRNA

## 2021-02-08 NOTE — H&P (Signed)
Neurology Consultation  CC: Slurred speech, headache  History is obtained from: Patient, chart  HPI: Richard Oneal is a 50 y.o. male hypercholesterolemia, presented to [facility] emergency room for evaluation of headache, left-sided numbness, slurred speech and gait imbalance.  He was evaluated by telemetry neurology per report-no acute IV thrombolysis offered.  CT head unremarkable.  CTA head and neck recommended.  CTA head and neck revealed bilateral vertebral artery occlusion intracranially and proximal basilar occlusion.  After these findings were made available to the ED providers at [facility], they called the neuro interventionalist-Dr. D. Dorice Lamas for acute intervention, who accepted the patient for acute intervention.  Patient was transported via [facility] to [facility]. I saw the patient at the ER bridge.  He reports that he was well sometime last night-could not give me the exact time.  Started having a headache followed by difficulty walking.  His main issue is gait imbalance.  He did not feel numb.  He feels his speech is somewhat slurred.  He did not complain of any other neurological issues.  He was very emotional and tearful. The interventionalists also spoke with the patient's sister to update with the plan for intervention  LKW: 9 PM yesterday-01/30/2021 tpa given?: no, decision made at the outside hospital-records not available Premorbid modified Rankin scale (mRS): 0 ROS: Full ROS was performed and is negative except as noted in the HPI.   Past Medical History:  Diagnosis Date   Dental caries    and periodontitis   Hypercholesterolemia    Family History  Problem Relation Age of Onset   Heart attack Mother    Social History:   reports that he has been smoking cigars. He has never used smokeless tobacco. He reports that he does not currently use alcohol. He reports that he does not currently use drugs.  Medications  Current Facility-Administered  Medications:     stroke: mapping our early stages of recovery book, , Does not apply, Once, Milon Dikes, MD   0.9 %  sodium chloride infusion, , Intravenous, Continuous, Milon Dikes, MD   acetaminophen (TYLENOL) tablet 650 mg, 650 mg, Oral, Q4H PRN **OR** acetaminophen (TYLENOL) 160 MG/5ML solution 650 mg, 650 mg, Per Tube, Q4H PRN **OR** acetaminophen (TYLENOL) suppository 650 mg, 650 mg, Rectal, Q4H PRN, Milon Dikes, MD   iohexol (OMNIPAQUE) 300 MG/ML solution 100 mL, 100 mL, Intra-arterial, Once PRN, de Melchor Amour, Jerilynn Mages, MD   iohexol (OMNIPAQUE) 300 MG/ML solution 100 mL, 100 mL, Intra-arterial, Once PRN, de Melchor Amour, Jerilynn Mages, MD   lidocaine (XYLOCAINE) 1 % (with pres) injection, , , ,    senna-docusate (Senokot-S) tablet 1 tablet, 1 tablet, Oral, QHS PRN, Milon Dikes, MD  Current Outpatient Medications:    amoxicillin (AMOXIL) 500 MG capsule, Take 1 capsule (500 mg total) by mouth 3 (three) times daily., Disp: 21 capsule, Rfl: 0   atorvastatin (LIPITOR) 20 MG tablet, Take 20 mg by mouth daily., Disp: , Rfl:    hydrOXYzine (VISTARIL) 25 MG capsule, Take 50 mg by mouth 2 (two) times daily., Disp: , Rfl:    levothyroxine (SYNTHROID) 25 MCG tablet, Take 25 mcg by mouth daily., Disp: , Rfl:    oxyCODONE-acetaminophen (PERCOCET) 5-325 MG tablet, Take 1 tablet by mouth every 4 (four) hours as needed., Disp: 30 tablet, Rfl: 0   rosuvastatin (CRESTOR) 10 MG tablet, Take 10 mg by mouth at bedtime., Disp: , Rfl:   Facility-Administered Medications Ordered in Other Encounters:    0.9 %  sodium chloride infusion, , Intravenous, Continuous PRN, Macie Burows, CRNA, New Bag at 02/16/2021 0840   dexamethasone (DECADRON) injection, , Intravenous, Anesthesia Intra-op, Huel Cote B, CRNA, 5 mg at 02/04/2021 0905   fentaNYL citrate (PF) (SUBLIMAZE) injection, , Intravenous, Anesthesia Intra-op, Huel Cote B, CRNA, 100 mcg at 01/27/2021 0855   phenylephrine (NEO-SYNEPHRINE)  20mg /NS premix infusion, , Intravenous, Continuous PRN, B, CRNA, Stopped at 02/07/2021 0918   propofol (DIPRIVAN) 10 mg/mL bolus/IV push, , Intravenous, Anesthesia Intra-op, 02/10/21 B, CRNA, 160 mg at 01/30/2021 0855   rocuronium bromide 10 mg/mL (PF) syringe, , Intravenous, Anesthesia Intra-op, 02/10/21 B, CRNA, 60 mg at 02/03/2021 0900   succinylcholine (ANECTINE) syringe, , Intravenous, Anesthesia Intra-op, 02/10/21 B, CRNA, 140 mg at 01/29/2021 0855  Exam: Current vital signs: There were no vitals taken for this visit. Vital signs in last 24 hours:    General: Awake alert in no distress HEENT: Normocephalic/atraumatic Lungs: Clear Cardiovascular: Regular rate rhythm, S1-S2 heard Abdomen soft nondistended nontender Extremities warm well perfused Neurological exam Awake alert oriented x3 Speech has scanning quality dysarthria No evidence of aphasia Cranial nerves: Pupils equal round react light, extract movements intact, visual fields full, subtle facial asymmetry at rest with left facial droop, tongue and palate midline. Motor examination with no drift in any of the 4 extremities. Sensation intact to light touch Coordination with no dysmetria Gait testing deferred NIH-2  Labs I have reviewed labs in epic and the results pertinent to this consultation are:   CBC    Component Value Date/Time   WBC 9.2 07/27/2017 0634   RBC 4.92 07/27/2017 0634   HGB 15.5 07/27/2017 0634   HCT 45.9 07/27/2017 0634   PLT 225 07/27/2017 0634   MCV 93.3 07/27/2017 0634   MCH 31.5 07/27/2017 0634   MCHC 33.8 07/27/2017 0634   RDW 12.7 07/27/2017 0634    CMP     Component Value Date/Time   NA 135 07/27/2017 0634   K 3.8 07/27/2017 0634   CL 102 07/27/2017 0634   CO2 24 07/27/2017 0634   GLUCOSE 96 07/27/2017 0634   BUN 9 07/27/2017 0634   CREATININE 0.85 07/27/2017 0634   CALCIUM 8.8 (L) 07/27/2017 0634   GFRNONAA >60 07/27/2017 0634   GFRAA >60  07/27/2017 07/29/2017   Imaging I have reviewed the images obtained:  CT-head-no acute changes CTA head and neck:  IMPRESSION: 1. Large vessel occlusion affecting the bilateral intradural vertebral arteries and proximal basilar, presumably emergent in this setting. 2. Diffusely attenuated right vertebral artery with V3 segment occlusion, would consider dissection especially if there is history of trauma or neck pain. 3. High-grade right M2 branch stenosis. 4. Premature coronary atherosclerosis.  MRI examination of the brain-ordered and pending  Assessment: 50 year old man with sudden onset of gait instability, left-sided numbness, headache and some visual changes with last known well sometime last night presented to [facility] ER.  Emergent telemedicine neurology evaluation with no emergent IV thrombolysis offered.  Further work-up with CTA head and neck ordered that showed large vessel occlusion affecting bilateral intradural vertebral arteries and proximal basilar occlusion-presumably emergent in the setting with diffusely attenuated right vertebral artery with V3 segment occlusion.  High-grade stenosis of the right M2 branches.  Premature coronary atherosclerosis. Case was discussed with the interventionalist by the ED provider at [facility] interventionalists excepted the patient for intervention. I saw and evaluated the patient at the ER bridge with an NIH of 2. Patient was taken  in for emergent intervention and currently is in IR. 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 Check CBC BMP Replete electrolytes as needed Gentle hydration with normal saline 75 cc an hour Other labs to include urinalysis and toxicology screen   Addendum Received call from the neuro interventionalists.  Unsuccessful revascularization with possible dissection of the vertebral artery.  Procedure aborted due to risk for further extension of dissection versus vessel perforation.  Patient will be transferred to the ICU. MRI with strokes in the brainstem and cerebellum and also maybe int he left thalamus. Plan to extubate when able to. No further IR intervention planned for now.    -- Milon Dikes, MD Neurologist [facility] Pager: [phone]  CRITICAL CARE ATTESTATION Performed by: Milon Dikes, MD Total critical care time: 30 minutes Critical care time was exclusive of separately billable procedures and treating other patients and/or supervising APPs/Residents/Students Critical care was necessary to treat or prevent imminent or life-threatening deterioration due to bilateral vertebral artery and basilar artery occlusion. This patient is critically ill and at significant risk for neurological worsening and/or death and care requires constant monitoring. Critical care was time spent personally by me on the following activities: development of treatment plan with patient and/or surrogate as well as nursing, discussions with consultants, evaluation of patient's response to treatment, examination of patient, obtaining history from patient or surrogate, ordering and performing treatments and interventions, ordering and review of laboratory studies, ordering and review of radiographic studies, pulse oximetry, re-evaluation of patient's condition, participation in multidisciplinary rounds and medical decision making of high complexity in the  care of this patient.

## 2021-02-08 NOTE — Anesthesia Preprocedure Evaluation (Signed)
Anesthesia Evaluation  Patient identified by MRN, date of birth, ID band  Reviewed: Unable to perform ROS - Chart review onlyPreop documentation limited or incomplete due to emergent nature of procedure.  Airway        Dental   Pulmonary    breath sounds clear to auscultation       Cardiovascular hypertension,  Rhythm:Regular     Neuro/Psych CVA, Residual Symptoms    GI/Hepatic   Endo/Other    Renal/GU      Musculoskeletal   Abdominal   Peds  Hematology   Anesthesia Other Findings   Reproductive/Obstetrics                             Anesthesia Physical Anesthesia Plan  ASA: 3 and emergent  Anesthesia Plan: General   Post-op Pain Management:    Induction: Intravenous, Rapid sequence and Cricoid pressure planned  PONV Risk Score and Plan: 2 and Ondansetron  Airway Management Planned: Oral ETT  Additional Equipment: None  Intra-op Plan:   Post-operative Plan: Possible Post-op intubation/ventilation  Informed Consent:     History available from chart only and Only emergency history available  Plan Discussed with: CRNA and Anesthesiologist  Anesthesia Plan Comments:         Anesthesia Quick Evaluation

## 2021-02-08 NOTE — Consult Note (Signed)
NAME:  Richard Oneal, MRN:  [id number], DOB:  1970/11/09, LOS: 0 ADMISSION DATE:  02/01/2021, CONSULTATION DATE:  02/10/2021 REFERRING MD:  Wilford Corner , CHIEF COMPLAINT:  slurred speech   History of Present Illness:  Richard Oneal is a 50 y.o. M with PMH of HL who presented to [facility] ED for headache.  He was noted to have slurred speech and code stroke was activated.  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  LKW was the night before presentation, so was outside the window for TPA.    On arrival to [facility] was taken to IR, however were not able to gain access to the true lumen of the L vertebral artery and developed collaterals in the bilateral PCA's suggested possible chronic occlusion, therefore the procedure was aborted.  Pt left intubated and MRI ordered. PCCM consulted for ventilator management.   Pertinent  Medical History   has a past medical history of Dental caries and Hypercholesterolemia.   Significant Hospital Events: Including procedures, antibiotic start and stop dates in addition to other pertinent events   12/13 Transferred from [place] to [facility], thrombectomy unsuccessful, left intubated pending MRI and PCCM consult for vent management, on Cleviprex  Interim History / Subjective:   Pt seen in PACU, hypertensive but had just been started on Cleviprex.  Heavily sedated on propofol/fentanyl  Objective   Blood pressure (!) 193/117, pulse 76, temperature (!) 97.5 F (36.4 C), resp. rate 16, SpO2 100 %.    Vent Mode: PRVC FiO2 (%):  [40 %] 40 % Set Rate:  [16 bmp] 16 bmp Vt Set:  [550 mL] 550 mL PEEP:  [5 cmH20] 5 cmH20 Plateau Pressure:  [16 cmH20] 16 cmH20   Intake/Output Summary (Last 24 hours) at 02/09/2021 1253 Last data filed at 02/14/2021 1152 Gross per 24 hour  Intake 1000 ml  Output --  Net 1000 ml   There were no vitals filed for this  visit.  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 Hypertensive urgency Hx of HL Pt intubated for attempted thrombectomy, however were not able to gain access to the true lumen of the L vertebral artery and developed collaterals in the bilateral PCA's suggested possible chronic occlusion P: -left intubated pending MRI and further assessment of neuro status post-aborted procedure -check ABG and CXR -continue sedation pending MRI results -Neurology primary team, admit to ICU for frequent neuro checks, continue antiplatelet therapy, echo -BP goal 120-140, currently requiring Cleviprex gtt -Maintain full vent support with SAT/SBT as tolerated -titrate Vent setting to maintain SpO2 greater than or equal to 90%. -HOB elevated 30 degrees. -Plateau pressures less than 30 cm H20.  -Follow chest x-ray, ABG prn.   -Bronchial hygiene and RT/bronchodilator protocol.    Best Practice (right click and "Reselect all SmartList Selections" daily)   Diet/type: NPO DVT prophylaxis: SCD GI prophylaxis: PPI Lines: N/A Foley:  N/A Code Status:  full code Last date of multidisciplinary  goals of care discussion [pending ]  Labs   CBC: No results for input(s): WBC, NEUTROABS, HGB, HCT, MCV, PLT in the last 168 hours.  Basic Metabolic Panel: No results for input(s): NA, K, CL, CO2, GLUCOSE, BUN, CREATININE, CALCIUM, MG, PHOS in the last 168 hours. GFR: CrCl cannot be calculated (Patient's most recent lab  result is older than the maximum 21 days allowed.). No results for input(s): PROCALCITON, WBC, LATICACIDVEN in the last 168 hours.  Liver Function Tests: No results for input(s): AST, ALT, ALKPHOS, BILITOT, PROT, ALBUMIN in the last 168 hours. No results for input(s): LIPASE, AMYLASE in the last 168 hours. No results for input(s): AMMONIA in the last 168 hours.  ABG No results found for: PHART, PCO2ART, PO2ART, HCO3, TCO2, ACIDBASEDEF, O2SAT   Coagulation Profile: No results for input(s): INR, PROTIME in the last 168 hours.  Cardiac Enzymes: No results for input(s): CKTOTAL, CKMB, CKMBINDEX, TROPONINI in the last 168 hours.  HbA1C: No results found for: HGBA1C  CBG: No results for input(s): GLUCAP in the last 168 hours.  Review of Systems:   Unable to obtain secondary to intubation  Past Medical History:  He,  has a past medical history of Dental caries and Hypercholesterolemia.   Surgical History:   Past Surgical History:  Procedure Laterality Date   dental extractions     MULTIPLE EXTRACTIONS WITH ALVEOLOPLASTY N/A 07/27/2017   Procedure: EXTRACTION teeth 2,3,4,5,6,12,15,19,20,21,22,27,28,29 WITH ALVEOLOPLASTY;  Surgeon: Ocie Doyne, DDS;  Location: [facility] OR;  Service: Oral Surgery;  Laterality: N/A;     Social History:   reports that he has been smoking cigars. He has never used smokeless tobacco. He reports that he does not currently use alcohol. He reports that he does not currently use drugs.   Family History:  His family history includes Heart attack in his mother.   Allergies No Known Allergies   Home Medications  Prior to Admission medications   Medication Sig Start Date End Date Taking? Authorizing Provider  amoxicillin (AMOXIL) 500 MG capsule Take 1 capsule (500 mg total) by mouth 3 (three) times daily. 07/27/17   Ocie Doyne, DMD  atorvastatin (LIPITOR) 20 MG tablet Take 20 mg by mouth daily.    [provider]  hydrOXYzine (VISTARIL) 25 MG  capsule Take 50 mg by mouth 2 (two) times daily. 01/26/21   [provider]  levothyroxine (SYNTHROID) 25 MCG tablet Take 25 mcg by mouth daily. 01/06/21   [provider]  oxyCODONE-acetaminophen (PERCOCET) 5-325 MG tablet Take 1 tablet by mouth every 4 (four) hours as needed. 07/27/17   Ocie Doyne, DMD  rosuvastatin (CRESTOR) 10 MG tablet Take 10 mg by mouth at bedtime. 01/06/21   [provider]     Critical care time:  37 minutes     CRITICAL CARE Performed by: Darcella Gasman Bryar Rennie   Total critical care time: 37 minutes  Critical care time was exclusive of separately billable procedures and treating other patients.  Critical care was necessary to treat or prevent imminent or life-threatening deterioration.  Critical care was time spent personally by me on the following activities: development of treatment plan with patient and/or surrogate as well as nursing, discussions with consultants, evaluation of patient's response to treatment, examination of patient, obtaining history from patient or surrogate, ordering and performing treatments and interventions, ordering and review of laboratory studies, ordering and review of radiographic studies, pulse oximetry and re-evaluation of patient's condition.  Darcella Gasman Jaques Mineer, PA-C [facility]  See Amion for pager If no response to pager , please call [phone] until 7pm After 7:00 pm call Elink  [phone]

## 2021-02-09 ENCOUNTER — Inpatient Hospital Stay (HOSPITAL_COMMUNITY): Payer: Medicaid Other

## 2021-02-09 ENCOUNTER — Encounter (HOSPITAL_COMMUNITY): Payer: Self-pay | Admitting: Radiology

## 2021-02-09 DIAGNOSIS — I6389 Other cerebral infarction: Secondary | ICD-10-CM

## 2021-02-09 DIAGNOSIS — I6503 Occlusion and stenosis of bilateral vertebral arteries: Secondary | ICD-10-CM

## 2021-02-09 DIAGNOSIS — I63233 Cerebral infarction due to unspecified occlusion or stenosis of bilateral carotid arteries: Secondary | ICD-10-CM

## 2021-02-09 DIAGNOSIS — Z9911 Dependence on respirator [ventilator] status: Secondary | ICD-10-CM

## 2021-02-09 DIAGNOSIS — I63213 Cerebral infarction due to unspecified occlusion or stenosis of bilateral vertebral arteries: Secondary | ICD-10-CM

## 2021-02-09 LAB — LIPID PANEL
Cholesterol: 157 mg/dL (ref 0–200)
HDL: 40 mg/dL — ABNORMAL LOW (ref >40)
LDL Cholesterol: 54 mg/dL (ref 0–99)
Total CHOL/HDL Ratio: 3.9 RATIO
Triglycerides: 315 mg/dL — ABNORMAL HIGH (ref <150)
VLDL: 63 mg/dL — ABNORMAL HIGH (ref 0–40)

## 2021-02-09 LAB — CBC WITH DIFFERENTIAL/PLATELET
Abs Immature Granulocytes: 0.03 10*3/uL (ref 0.00–0.07)
Basophils Absolute: 0 10*3/uL (ref 0.0–0.1)
Basophils Relative: 0 %
Eosinophils Absolute: 0 10*3/uL (ref 0.0–0.5)
Eosinophils Relative: 0 %
HCT: 42.8 % (ref 39.0–52.0)
Hemoglobin: 14.8 g/dL (ref 13.0–17.0)
Immature Granulocytes: 0 %
Lymphocytes Relative: 13 %
Lymphs Abs: 1.7 10*3/uL (ref 0.7–4.0)
MCH: 31.2 pg (ref 26.0–34.0)
MCHC: 34.6 g/dL (ref 30.0–36.0)
MCV: 90.1 fL (ref 80.0–100.0)
Monocytes Absolute: 1.3 10*3/uL — ABNORMAL HIGH (ref 0.1–1.0)
Monocytes Relative: 10 %
Neutro Abs: 10.5 10*3/uL — ABNORMAL HIGH (ref 1.7–7.7)
Neutrophils Relative %: 77 %
Platelets: 247 10*3/uL (ref 150–400)
RBC: 4.75 MIL/uL (ref 4.22–5.81)
RDW: 12.4 % (ref 11.5–15.5)
WBC: 13.6 10*3/uL — ABNORMAL HIGH (ref 4.0–10.5)
nRBC: 0 % (ref 0.0–0.2)

## 2021-02-09 LAB — HEMOGLOBIN A1C
Hgb A1c MFr Bld: 4.9 % (ref 4.8–5.6)
Mean Plasma Glucose: 93.93 mg/dL

## 2021-02-09 LAB — COMPREHENSIVE METABOLIC PANEL
ALT: 14 U/L (ref 0–44)
AST: 14 U/L — ABNORMAL LOW (ref 15–41)
Albumin: 4.2 g/dL (ref 3.5–5.0)
Alkaline Phosphatase: 48 U/L (ref 38–126)
Anion gap: 10 (ref 5–15)
BUN: 17 mg/dL (ref 6–20)
CO2: 22 mmol/L (ref 22–32)
Calcium: 9.1 mg/dL (ref 8.9–10.3)
Chloride: 102 mmol/L (ref 98–111)
Creatinine, Ser: 1.03 mg/dL (ref 0.61–1.24)
GFR, Estimated: >60 mL/min (ref >60)
Glucose, Bld: 116 mg/dL — ABNORMAL HIGH (ref 70–99)
Potassium: 3.3 mmol/L — ABNORMAL LOW (ref 3.5–5.1)
Sodium: 134 mmol/L — ABNORMAL LOW (ref 135–145)
Total Bilirubin: 0.7 mg/dL (ref 0.3–1.2)
Total Protein: 6.7 g/dL (ref 6.5–8.1)

## 2021-02-09 LAB — ECHOCARDIOGRAM COMPLETE BUBBLE STUDY
Area-P 1/2: 3.5 cm2
Height: 66 in
S' Lateral: 2.6 cm
Weight: 2821.89 oz

## 2021-02-09 LAB — GLUCOSE, CAPILLARY
Glucose-Capillary: 116 mg/dL — ABNORMAL HIGH (ref 70–99)
Glucose-Capillary: 133 mg/dL — ABNORMAL HIGH (ref 70–99)
Glucose-Capillary: 138 mg/dL — ABNORMAL HIGH (ref 70–99)
Glucose-Capillary: 143 mg/dL — ABNORMAL HIGH (ref 70–99)
Glucose-Capillary: 144 mg/dL — ABNORMAL HIGH (ref 70–99)
Glucose-Capillary: 146 mg/dL — ABNORMAL HIGH (ref 70–99)

## 2021-02-09 LAB — TRIGLYCERIDES: Triglycerides: 321 mg/dL — ABNORMAL HIGH (ref <150)

## 2021-02-09 LAB — MAGNESIUM: Magnesium: 2 mg/dL (ref 1.7–2.4)

## 2021-02-09 IMAGING — DX DG ABD PORTABLE 1V
1 series · 1 of 1 positions shown · non-contrast
Comparison: Chest and abdominal radiographs [DATE].

CLINICAL DATA: Vomiting.  On ventilator.

EXAM:
PORTABLE ABDOMEN - 1 VIEW

[abdomen]
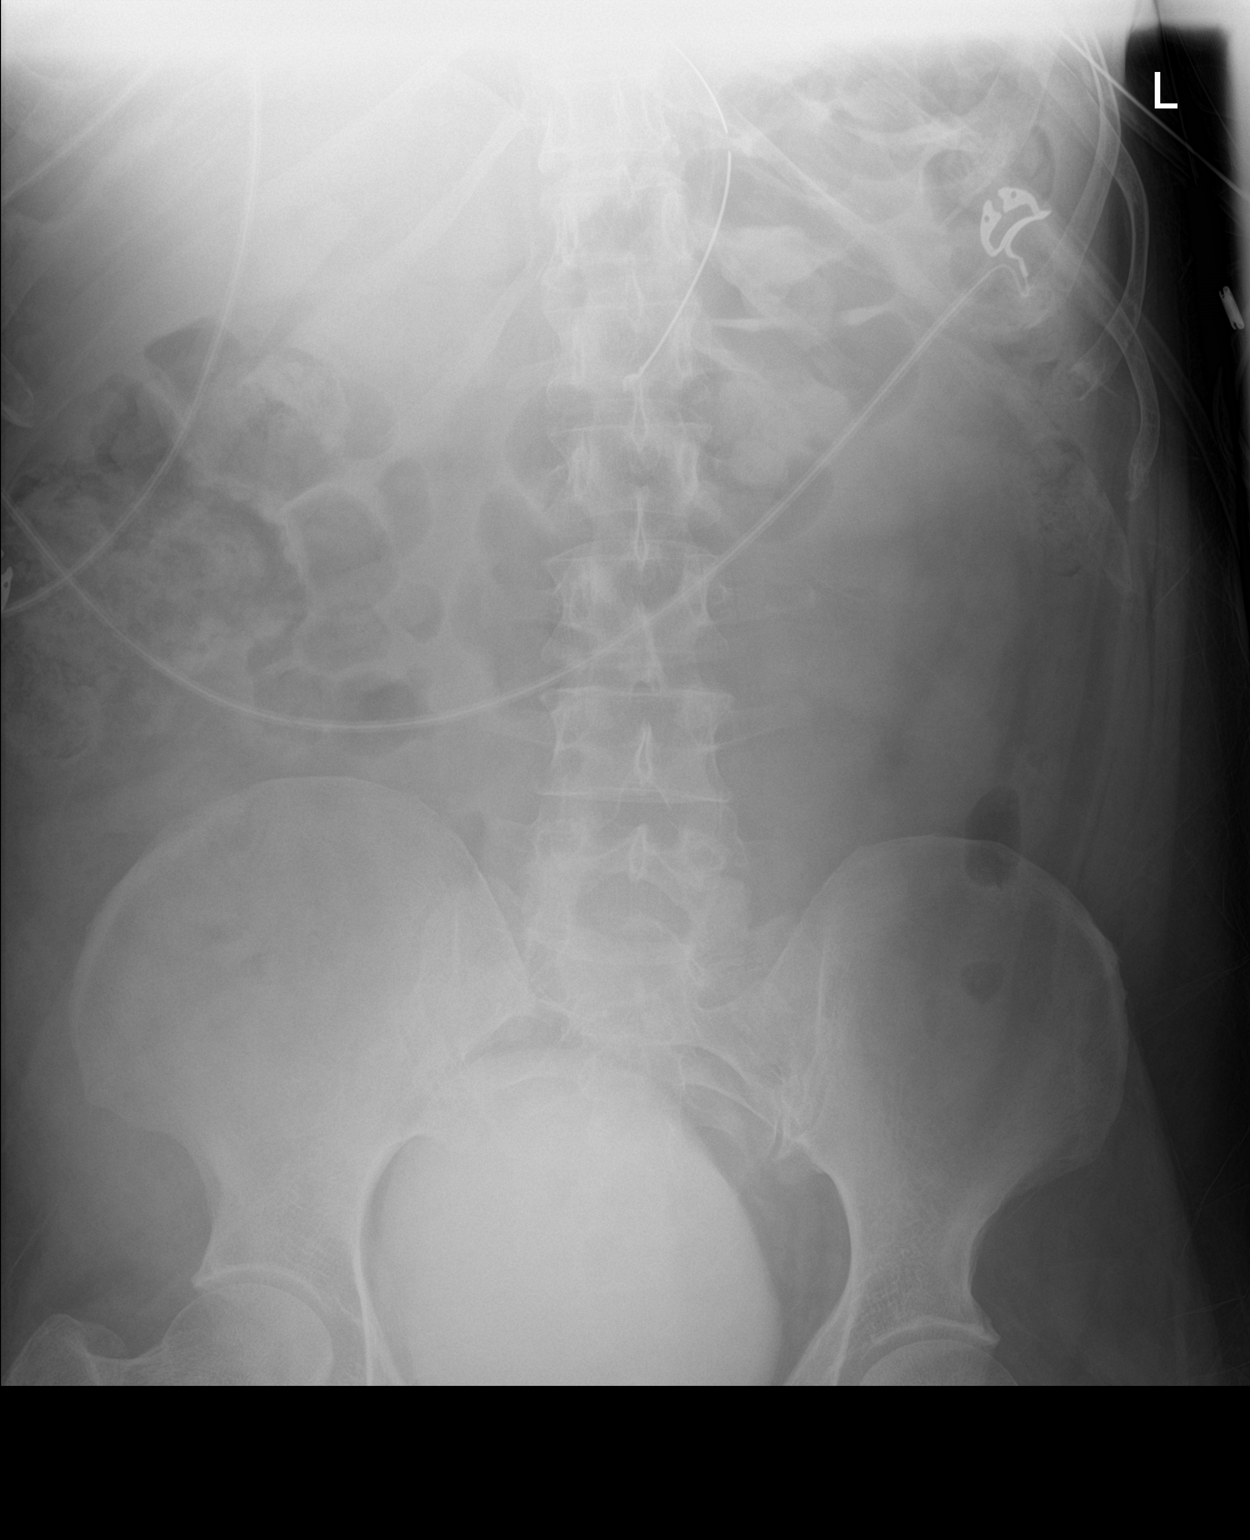

[1 of 1 positions shown; findings below may reference images not displayed]

FINDINGS: [KK] hours. Enteric tube tip projects to the L1 level, consistent
with position in the mid stomach. The visualized bowel gas pattern
is normal. The bladder is moderately distended with contrast,
attributed to recent CT angiography. No supine evidence of free
intraperitoneal air. The bones appear unremarkable.
IMPRESSION: No evidence of acute abdominal process. Enteric tube projects to the
level of the mid stomach.

## 2021-02-09 MED ORDER — ROSUVASTATIN CALCIUM 20 MG PO TABS: 20.0000 mg | ORAL_TABLET | Freq: Every day | ORAL | Status: DC

## 2021-02-09 MED ORDER — ASPIRIN 325 MG PO TABS: 325.0000 mg | ORAL_TABLET | Freq: Every day | ORAL | Status: DC

## 2021-02-09 MED ORDER — METOCLOPRAMIDE HCL 5 MG/ML IJ SOLN: 10.0000 mg | Freq: Four times a day (QID) | INTRAMUSCULAR | Status: DC

## 2021-02-09 MED ORDER — METOCLOPRAMIDE HCL 5 MG/ML IJ SOLN
10.0000 mg | Freq: Four times a day (QID) | INTRAMUSCULAR | Status: DC
Start: 2021-02-09 — End: 2021-02-13
  Administered 2021-02-09 – 2021-02-13 (×15): 10 mg via INTRAVENOUS
  Filled 2021-02-09 (×15): qty 2

## 2021-02-09 MED ORDER — LEVOTHYROXINE SODIUM 25 MCG PO TABS: 25.0000 ug | ORAL_TABLET | Freq: Every day | ORAL | Status: DC

## 2021-02-09 MED ORDER — ONDANSETRON HCL 4 MG/2ML IJ SOLN
4.0000 mg | Freq: Four times a day (QID) | INTRAMUSCULAR | Status: DC | PRN
  Administered 2021-02-09 (×2): 4 mg via INTRAVENOUS
  Filled 2021-02-09: qty 2

## 2021-02-09 MED ORDER — POLYETHYLENE GLYCOL 3350 17 G PO PACK: 17.0000 g | PACK | Freq: Every day | ORAL | Status: DC

## 2021-02-09 MED ORDER — CHLORHEXIDINE GLUCONATE CLOTH 2 % EX PADS
6.0000 | MEDICATED_PAD | Freq: Every day | CUTANEOUS | Status: DC
  Administered 2021-02-09 – 2021-02-12 (×4): 6 via TOPICAL

## 2021-02-09 MED ORDER — DOCUSATE SODIUM 50 MG/5ML PO LIQD
100.0000 mg | Freq: Two times a day (BID) | ORAL | Status: DC
  Filled 2021-02-09 (×2): qty 10

## 2021-02-09 MED ORDER — ONDANSETRON HCL 4 MG/2ML IJ SOLN
INTRAMUSCULAR | Status: AC
  Filled 2021-02-09: qty 2

## 2021-02-09 MED ORDER — DEXMEDETOMIDINE HCL IN NACL 400 MCG/100ML IV SOLN
0.4000 ug/kg/h | INTRAVENOUS | Status: DC
Start: 2021-02-09 — End: 2021-02-12
  Administered 2021-02-09: 11:00:00 0.4 ug/kg/h via INTRAVENOUS
  Administered 2021-02-10: 0.7 ug/kg/h via INTRAVENOUS
  Administered 2021-02-10 (×2): 0.6 ug/kg/h via INTRAVENOUS
  Filled 2021-02-09 (×4): qty 100

## 2021-02-09 MED ORDER — METOCLOPRAMIDE HCL 5 MG/ML IJ SOLN: 5.0000 mg | Freq: Four times a day (QID) | INTRAMUSCULAR | Status: DC | PRN

## 2021-02-09 MED ORDER — FAMOTIDINE 20 MG PO TABS
20.0000 mg | ORAL_TABLET | Freq: Two times a day (BID) | ORAL | Status: DC
  Administered 2021-02-09: 10:00:00 20 mg
  Filled 2021-02-09: qty 1

## 2021-02-09 MED ORDER — SENNOSIDES-DOCUSATE SODIUM 8.6-50 MG PO TABS: 1.0000 | ORAL_TABLET | Freq: Every evening | ORAL | Status: DC | PRN

## 2021-02-09 MED ORDER — DOCUSATE SODIUM 100 MG PO CAPS: 100.0000 mg | ORAL_CAPSULE | Freq: Two times a day (BID) | ORAL | Status: DC

## 2021-02-09 MED ORDER — POTASSIUM CHLORIDE 20 MEQ PO PACK
40.0000 meq | PACK | Freq: Once | ORAL | Status: AC
  Administered 2021-02-09: 10:00:00 40 meq
  Filled 2021-02-09: qty 2

## 2021-02-09 MED ORDER — ASPIRIN 325 MG PO TABS
325.0000 mg | ORAL_TABLET | Freq: Every day | ORAL | Status: DC
  Administered 2021-02-09: 10:00:00 325 mg
  Filled 2021-02-09: qty 1

## 2021-02-09 MED ORDER — ROSUVASTATIN CALCIUM 20 MG PO TABS
20.0000 mg | ORAL_TABLET | Freq: Every day | ORAL | Status: DC
  Administered 2021-02-09: 10:00:00 20 mg
  Filled 2021-02-09: qty 1

## 2021-02-09 MED ORDER — DIPHENHYDRAMINE HCL 50 MG/ML IJ SOLN
25.0000 mg | Freq: Once | INTRAMUSCULAR | Status: AC
  Administered 2021-02-09: 16:00:00 25 mg via INTRAVENOUS
  Filled 2021-02-09: qty 1

## 2021-02-09 MED ORDER — PANTOPRAZOLE SODIUM 40 MG IV SOLR
40.0000 mg | INTRAVENOUS | Status: DC
Start: 2021-02-09 — End: 2021-02-10
  Administered 2021-02-09: 14:00:00 40 mg via INTRAVENOUS
  Filled 2021-02-09 (×2): qty 40

## 2021-02-09 NOTE — Progress Notes (Signed)
°  Echocardiogram 2D Echocardiogram with bubble study has been performed.  Richard Oneal M 02/09/2021, 10:05 AM

## 2021-02-09 NOTE — Progress Notes (Signed)
SLP Cancellation Note  Patient Details Name: Richard Oneal MRN: [id number] DOB: October 01, 1970   Cancelled treatment:       Reason Eval/Treat Not Completed: Medical issues which prohibited therapy (Pt current intubated. SLP will follow up on subsequent date.)  Richard Oneal I. Vear Clock, MS, CCC-SLP Acute Rehabilitation Services Office number [phone] Pager [phone]  Scheryl Marten 02/09/2021, 9:24 AM

## 2021-02-09 NOTE — Progress Notes (Signed)
eLink Physician-Brief Progress Note Patient Name: Richard Oneal DOB: 08/12/1970 MRN: [id number]   Date of Service  02/09/2021  HPI/Events of Note  Patient with nausea not fully resolved by Zofran.  eICU Interventions  PRN Reglan ordered for nausea not resolved by Zofran.        Thomasene Lot Richard Oneal 02/09/2021, 8:16 PM

## 2021-02-09 NOTE — Progress Notes (Signed)
OT Cancellation Note  Patient Details Name: Quinntin Malter MRN: [id number] DOB: 09/07/1970   Cancelled Treatment:    Reason Eval/Treat Not Completed: Patient not medically ready. Pt currently sedated and intubated. Per nurse, plan to extubate later today. Plan to reattempt as able.   Raynald Kemp, OT Acute Rehabilitation Services Office: [phone]  02/09/2021, 9:40 AM

## 2021-02-09 NOTE — Progress Notes (Signed)
Supervising Physician: Pedro Earls  Patient Status:  [facility] - In-pt  Chief Complaint:  Code stroke  Subjective:  1 day s/p diagnostic angiogram and attempted posterior circulation revascularization.  Pt intubated and sedated, and on cleviprex gtt  Per RN: no overnight events.  Family expected to arrive this am and has asked to be present  during extubation as pt has baseline cognitive deficits.  Allergies: Patient has no known allergies.  Medications: Prior to Admission medications   Medication Sig Start Date End Date Taking? Authorizing Provider  amoxicillin (AMOXIL) 500 MG capsule Take 1 capsule (500 mg total) by mouth 3 (three) times daily. 07/27/17   Diona Browner, DMD  hydrOXYzine (VISTARIL) 25 MG capsule Take 50 mg by mouth 2 (two) times daily. 01/26/21   [provider]  levothyroxine (SYNTHROID) 25 MCG tablet Take 25 mcg by mouth daily. 01/06/21   [provider]  oxyCODONE-acetaminophen (PERCOCET) 5-325 MG tablet Take 1 tablet by mouth every 4 (four) hours as needed. 07/27/17   Diona Browner, DMD  rosuvastatin (CRESTOR) 10 MG tablet Take 10 mg by mouth at bedtime. 01/06/21   [provider]     Vital Signs: BP (!) 144/81    Pulse 96    Temp 98.7 F (37.1 C) (Axillary)    Resp 20    Ht 5' 6" (1.676 m)    Wt 176 lb 5.9 oz (80 kg)    SpO2 98%    BMI 28.47 kg/m   Physical Exam Neurological:     Comments: He is non responsive sternal rub, but does withdraw at all 4 extremities with peripheral painful stimuli    Imaging: MR ANGIO HEAD WO CONTRAST  Result Date: 02/23/2021 CLINICAL DATA:  Stroke, follow up EXAM: MRI HEAD WITHOUT CONTRAST MRA HEAD WITHOUT CONTRAST TECHNIQUE: Multiplanar, multi-echo pulse sequences of the brain and surrounding structures were acquired without intravenous contrast. Angiographic images of the Circle of Willis were acquired using MRA technique without intravenous contrast. COMPARISON:  Same day  catheter arteriogram and CTA. FINDINGS: MRI HEAD FINDINGS Brain: Acute infarcts within the left greater than right cerebellum, pons and left thalamus. Mild edema without mass effect. Punctate acute infarct in the high right parietal white matter (series 5, image 90). No hydrocephalus, mass lesion, midline shift, or extra-axial fluid collection Vascular: Arterial vasculature is assessed below. T2 hyperintensity within the left transverse and sigmoid sinuses likely represents slow flow given these sinuses were patent on same day CTA. Skull and upper cervical spine: Normal marrow signal. Sinuses/Orbits: Moderate paranasal sinus mucosal thickening. Unremarkable orbits. Other: No mastoid effusions. MRA HEAD FINDINGS Anterior circulation: Bilateral intracranial ICAs are patent. Bilateral M1 MCAs and ACAs are patent without proximal high-grade stenosis. High-grade right M2 MCA stenosis. Posterior circulation: No flow related signal within the right intradural vertebral artery, compatible with known occlusion. The proximal left intradural vertebral artery demonstrates flow related signal up until the PICA origin. The left PICA appears patent. Distal to the PICA origin there is minimal flow related signal, which may be within a false lumen given findings on recent catheter arteriogram and CTA. No flow related signal within the proximal basilar artery, compatible with occlusion. Minimal flow related signal in the distal basilar artery. Focus of low intensity signal within the basilar tip may represent stagnant contrast from recent catheter arteriogram. Small bilateral posterior communicating arteries and posterior cerebral arteries. Poor flow related signal in the distal PCAs bilaterally. IMPRESSION: MRI: 1. Acute infarcts within the left  greater than right cerebellum, pons, and left thalamus.Mild edema without mass effect. 2. Additional punctate acute infarct in the high right parietal white matter. MRA: 1. 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 2. Small and poorly opacified PCAs bilaterally with small bilateral posterior communicating arteries. 3. Please see recent catheter arteriogram for additional details. These results will be called to the ordering clinician or representative by the Radiologist Assistant, and communication documented in the PACS or [employer]. Electronically Signed   By: Margaretha Sheffield M.D.   On: 02/07/2021 13:28   MR BRAIN WO CONTRAST  Result Date: 02/21/2021 CLINICAL DATA:  Stroke, follow up EXAM: MRI HEAD WITHOUT CONTRAST MRA HEAD WITHOUT CONTRAST TECHNIQUE: Multiplanar, multi-echo pulse sequences of the brain and surrounding structures were acquired without intravenous contrast. Angiographic images of the Circle of Willis were acquired using MRA technique without intravenous contrast. COMPARISON:  Same day catheter arteriogram and CTA. FINDINGS: MRI HEAD FINDINGS Brain: Acute infarcts within the left greater than right cerebellum, pons and left thalamus. Mild edema without mass effect. Punctate acute infarct in the high right parietal white matter (series 5, image 90). No hydrocephalus, mass lesion, midline shift, or extra-axial fluid collection Vascular: Arterial vasculature is assessed below. T2 hyperintensity within the left transverse and sigmoid sinuses likely represents slow flow given these sinuses were patent on same day CTA. Skull and upper cervical spine: Normal marrow signal. Sinuses/Orbits: Moderate paranasal sinus mucosal thickening. Unremarkable orbits. Other: No mastoid effusions. MRA HEAD FINDINGS Anterior circulation: Bilateral intracranial ICAs are patent. Bilateral M1 MCAs and ACAs are patent without proximal high-grade stenosis. High-grade right M2 MCA stenosis. Posterior circulation: No flow related signal within the right intradural vertebral artery, compatible with known occlusion. The  proximal left intradural vertebral artery demonstrates flow related signal up until the PICA origin. The left PICA appears patent. Distal to the PICA origin there is minimal flow related signal, which may be within a false lumen given findings on recent catheter arteriogram and CTA. No flow related signal within the proximal basilar artery, compatible with occlusion. Minimal flow related signal in the distal basilar artery. Focus of low intensity signal within the basilar tip may represent stagnant contrast from recent catheter arteriogram. Small bilateral posterior communicating arteries and posterior cerebral arteries. Poor flow related signal in the distal PCAs bilaterally. IMPRESSION: MRI: 1. Acute infarcts within the left greater than right cerebellum, pons, and left thalamus.Mild edema without mass effect. 2. Additional punctate acute infarct in the high right parietal white matter. MRA: 1. 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 2. Small and poorly opacified PCAs bilaterally with small bilateral posterior communicating arteries. 3. Please see recent catheter arteriogram for additional details. These results will be called to the ordering clinician or representative by the Radiologist Assistant, and communication documented in the PACS or [employer]. Electronically Signed   By: Margaretha Sheffield M.D.   On: 02/17/2021 13:28   IR CT Head Ltd  Result Date: 02/06/2021 INDICATION: 50 year old male with past medical history significant for hypertension hypercholesterolemia presented to outside hospital complaining of headaches. There, he was noted to have slurred speech and facial droop. His last known well was 8 p.m. on 02/07/2021. Head CT showed no large acute territorial infarct. 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 He was then transferred to our  service for a diagnostic cerebral angiogram and  mechanical thrombectomy. EXAM: ULTRASOUND-GUIDED VASCULAR ACCESS DIAGNOSTIC CEREBRAL ANGIOGRAM ATTEMPTED BASILAR ARTERY REVASCULARIZATION FLAT PANEL HEAD CT COMPARISON:  CT/CT angiogram of the head and neck February 08, 2021. MEDICATIONS: Refer to anesthesia documentation ANESTHESIA/SEDATION: The procedure was performed under general anesthesia. CONTRAST:  150 mL of Omnipaque 300 milligram/mL FLUOROSCOPY TIME:  Fluoroscopy Time: 65 minutes 6 seconds (2,666 mGy). COMPLICATIONS: SIR LEVEL B - Normal therapy, includes overnight admission for observation. TECHNIQUE: Informed written consent was obtained from the patient after a thorough discussion of the procedural risks, benefits and alternatives. All questions were addressed. Maximal Sterile Barrier Technique was utilized including caps, mask, sterile gowns, sterile gloves, sterile drape, hand hygiene and skin antiseptic. A timeout was performed prior to the initiation of the procedure. The right groin was prepped and draped in the usual sterile fashion. Using a micropuncture kit and the modified Seldinger technique, access was gained to the right common femoral artery and an 8 French sheath was placed. Real-time ultrasound guidance was utilized for vascular access including the acquisition of a permanent ultrasound image documenting patency of the accessed vessel. Under fluoroscopy, a Zoom 88 guide catheter was navigated over a 6 [place] Berenstein 2 catheter and a 0.035" Terumo Glidewire into the aortic arch. The catheter was placed into the left subclavian artery and then advanced into the left vertebral artery. The diagnostic catheter was removed. Frontal and lateral angiograms of the head were obtained. FINDINGS: 1. Normal caliber of the right common femoral artery, adequate for vascular access. 2. 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 PROCEDURE: Under biplane roadmap, a zoom 71  aspiration catheter was navigated into the left vertebral artery. The aspiration catheter connected to an aspiration pump then advanced towards the level of occlusion in the left vertebral artery. The catheter was noted to herniate into the left PICA and, therefore, the catheter was retracted. Frontal and lateral angiograms of the head were obtained in use at biplane roadmap. Then, coaxial navigation of a phenom 21 was performed over an Aristotle 14 micro guidewire into the intracranial left vertebral artery. The catheter was navigated over the wire into the vertebrobasilar junction. Frontal lateral angiograms were obtained via microcatheter contrast injection. Contrast was seen within distal bilateral vertebral arteries. No progression of contrast into the basilar artery noted. Further navigation of the catheter into the basilar artery was obtained without difficulty. However, non progression of the wire into the expected PCAs location was seen. Magnified frontal and lateral angiograms were obtained via microcatheter contrast injection. Contrast opacification was seen to be along the anterior wall of the basilar artery with flame shaped distally and no opacification of the posterior cerebral artery. Findings are consistent with dissection. Flat panel CT of the head was obtained and post processed in a separate workstation with concurrent attending physician supervision. Selected images were sent to PACS. No evidence of contrast extravasation noted. The microcatheter was retracted into the proximal intradural left vertebral artery. Frontal and lateral angiograms of the neck were obtained showing contrast opacification along the the false lumen in the basilar artery. Multiple attempts performed to gain access to the true lumen of the basilar artery proved unsuccessful. The microcatheter was subsequently withdrawn into the guide catheter. Flat panel CT of the head was obtained and post processed in a separate  workstation with concurrent attending physician supervision. Selected images were sent to PACS. No evidence of contrast extravasation after removal of microcatheter from false lumen. Follow-up left vertebral artery angiograms showed stable findings. The  meantime the catheter was subsequently withdrawn. A right common femoral artery angiogram showed access at the level of the deep artery of the thigh with high femoral artery bifurcation. Under fluoroscopy, a Zoom 88 guide catheter was navigated over a 6 [place] Berenstein 2 catheter and a 0.035" Terumo Glidewire into the aortic arch. The catheter was placed into the right subclavian artery and then advanced into the right vertebral artery. The diagnostic catheter was removed. Frontal and lateral angiograms of the head were obtained. The V2 segment of the right vertebral artery ends at the distal V2 segment as a muscular branch with no connection visible to the basilar artery. The catheter was subsequently advanced over the wire and under biplane roadmap into the right internal carotid artery. Frontal and lateral angiograms of the head were obtained. There is brisk contrast opacification of the right ACA and MCA vascular trees with delayed opacification of the left PCA an SCA primarily via leptomeningeal collaterals. No large posterior communicating artery identified. Under roadmap guidance, the catheter was then navigated into the left internal carotid artery. Frontal and lateral angiograms of the head were obtained. There is brisk contrast opacification of the left ACA and MCA vascular tree with opacification of the basilar tip and left PCA vascular tree seen via diminutive posterior communicating artery and leptomeningeal collaterals. Next, the catheter was again advanced into the left vertebral artery. Frontal and lateral angiograms of the head were obtained and used as biplane roadmap. Then, triaxial navigation of a phenom 21 was performed through a zoom 71 and  over an Aristotle 14 micro guidewire into the intracranial left vertebral artery. Attempt to advance the microcatheter into the true lumen resulted in herniation into the false lumen. The microcatheter was then left in the false lumen as an attempt to block this pathway. Subsequently, a Prowler microcatheter was navigated over a synchro 2 micro guidewire into the left vertebral artery. Attempt to gain access to the true lumen proved unsuccessful the catheter eventually herniating into the false lumen. Both catheters were subsequently withdrawn. 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 Flow to the PICA appear preserved. Interval system was subsequently withdrawn. Then, a Perclose pro style was utilized for access closure. Immediate hemostasis was achieved. 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 Multiple attempts to gain access to the true lumen proved unsuccessful and, therefore, procedure was aborted. PLAN: Patient will be transferred to MRI to evaluate stroke and vessel patency. Further management should be decided after MRI in conjunction with the neurology team. Family updated by telephone. Electronically Signed   By: Pedro Earls M.D.   On: 01/30/2021 14:43   IR CT Head Ltd  Result Date: 02/18/2021 INDICATION: 50 year old male with past medical history significant for hypertension hypercholesterolemia presented to outside hospital complaining of headaches. There, he was noted to have slurred speech and facial droop. His last known well was 8 p.m. on 02/07/2021. Head CT showed no large acute territorial infarct. 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 He was then transferred to our service for a diagnostic cerebral angiogram and mechanical thrombectomy. EXAM:  ULTRASOUND-GUIDED VASCULAR ACCESS DIAGNOSTIC CEREBRAL ANGIOGRAM ATTEMPTED BASILAR ARTERY REVASCULARIZATION FLAT PANEL HEAD CT COMPARISON:  CT/CT angiogram of the head and neck February 08, 2021. MEDICATIONS: Refer to anesthesia documentation ANESTHESIA/SEDATION: The procedure was performed under general anesthesia. CONTRAST:  150 mL  of Omnipaque 300 milligram/mL FLUOROSCOPY TIME:  Fluoroscopy Time: 65 minutes 6 seconds (2,666 mGy). COMPLICATIONS: SIR LEVEL B - Normal therapy, includes overnight admission for observation. TECHNIQUE: Informed written consent was obtained from the patient after a thorough discussion of the procedural risks, benefits and alternatives. All questions were addressed. Maximal Sterile Barrier Technique was utilized including caps, mask, sterile gowns, sterile gloves, sterile drape, hand hygiene and skin antiseptic. A timeout was performed prior to the initiation of the procedure. The right groin was prepped and draped in the usual sterile fashion. Using a micropuncture kit and the modified Seldinger technique, access was gained to the right common femoral artery and an 8 French sheath was placed. Real-time ultrasound guidance was utilized for vascular access including the acquisition of a permanent ultrasound image documenting patency of the accessed vessel. Under fluoroscopy, a Zoom 88 guide catheter was navigated over a 6 [place] Berenstein 2 catheter and a 0.035" Terumo Glidewire into the aortic arch. The catheter was placed into the left subclavian artery and then advanced into the left vertebral artery. The diagnostic catheter was removed. Frontal and lateral angiograms of the head were obtained. FINDINGS: 1. Normal caliber of the right common femoral artery, adequate for vascular access. 2. 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 PROCEDURE: Under biplane roadmap, a zoom 71 aspiration catheter was navigated into the left vertebral artery. The aspiration  catheter connected to an aspiration pump then advanced towards the level of occlusion in the left vertebral artery. The catheter was noted to herniate into the left PICA and, therefore, the catheter was retracted. Frontal and lateral angiograms of the head were obtained in use at biplane roadmap. Then, coaxial navigation of a phenom 21 was performed over an Aristotle 14 micro guidewire into the intracranial left vertebral artery. The catheter was navigated over the wire into the vertebrobasilar junction. Frontal lateral angiograms were obtained via microcatheter contrast injection. Contrast was seen within distal bilateral vertebral arteries. No progression of contrast into the basilar artery noted. Further navigation of the catheter into the basilar artery was obtained without difficulty. However, non progression of the wire into the expected PCAs location was seen. Magnified frontal and lateral angiograms were obtained via microcatheter contrast injection. Contrast opacification was seen to be along the anterior wall of the basilar artery with flame shaped distally and no opacification of the posterior cerebral artery. Findings are consistent with dissection. Flat panel CT of the head was obtained and post processed in a separate workstation with concurrent attending physician supervision. Selected images were sent to PACS. No evidence of contrast extravasation noted. The microcatheter was retracted into the proximal intradural left vertebral artery. Frontal and lateral angiograms of the neck were obtained showing contrast opacification along the the false lumen in the basilar artery. Multiple attempts performed to gain access to the true lumen of the basilar artery proved unsuccessful. The microcatheter was subsequently withdrawn into the guide catheter. Flat panel CT of the head was obtained and post processed in a separate workstation with concurrent attending physician supervision. Selected images were sent to  PACS. No evidence of contrast extravasation after removal of microcatheter from false lumen. Follow-up left vertebral artery angiograms showed stable findings. The meantime the catheter was subsequently withdrawn. A right common femoral artery angiogram showed access at the level of the deep artery of the thigh with high femoral artery bifurcation. Under fluoroscopy, a Zoom 88 guide catheter was navigated over a 6 [place] Berenstein 2 catheter and  a 0.035" Terumo Glidewire into the aortic arch. The catheter was placed into the right subclavian artery and then advanced into the right vertebral artery. The diagnostic catheter was removed. Frontal and lateral angiograms of the head were obtained. The V2 segment of the right vertebral artery ends at the distal V2 segment as a muscular branch with no connection visible to the basilar artery. The catheter was subsequently advanced over the wire and under biplane roadmap into the right internal carotid artery. Frontal and lateral angiograms of the head were obtained. There is brisk contrast opacification of the right ACA and MCA vascular trees with delayed opacification of the left PCA an SCA primarily via leptomeningeal collaterals. No large posterior communicating artery identified. Under roadmap guidance, the catheter was then navigated into the left internal carotid artery. Frontal and lateral angiograms of the head were obtained. There is brisk contrast opacification of the left ACA and MCA vascular tree with opacification of the basilar tip and left PCA vascular tree seen via diminutive posterior communicating artery and leptomeningeal collaterals. Next, the catheter was again advanced into the left vertebral artery. Frontal and lateral angiograms of the head were obtained and used as biplane roadmap. Then, triaxial navigation of a phenom 21 was performed through a zoom 71 and over an Aristotle 14 micro guidewire into the intracranial left vertebral artery. Attempt to  advance the microcatheter into the true lumen resulted in herniation into the false lumen. The microcatheter was then left in the false lumen as an attempt to block this pathway. Subsequently, a Prowler microcatheter was navigated over a synchro 2 micro guidewire into the left vertebral artery. Attempt to gain access to the true lumen proved unsuccessful the catheter eventually herniating into the false lumen. Both catheters were subsequently withdrawn. 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 Flow to the PICA appear preserved. Interval system was subsequently withdrawn. Then, a Perclose pro style was utilized for access closure. Immediate hemostasis was achieved. 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 Multiple attempts to gain access to the true lumen proved unsuccessful and, therefore, procedure was aborted. PLAN: Patient will be transferred to MRI to evaluate stroke and vessel patency. Further management should be decided after MRI in conjunction with the neurology team. Family updated by telephone. Electronically Signed   By: Pedro Earls M.D.   On: 02/25/2021 14:43   IR US Guide Vasc Access Right  Result Date: 02/21/2021 INDICATION: 49 year old male with past medical history significant for hypertension hypercholesterolemia presented to outside hospital complaining of headaches. There, he was noted to have slurred speech and facial droop. His last known well was 8 p.m. on 02/07/2021. Head CT showed no large acute territorial infarct. 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 He was then transferred to our service for a diagnostic cerebral angiogram and mechanical thrombectomy. EXAM: ULTRASOUND-GUIDED VASCULAR ACCESS DIAGNOSTIC CEREBRAL ANGIOGRAM ATTEMPTED  BASILAR ARTERY REVASCULARIZATION FLAT PANEL HEAD CT COMPARISON:  CT/CT angiogram of the head and neck February 08, 2021. MEDICATIONS: Refer to anesthesia documentation ANESTHESIA/SEDATION: The procedure was performed under general anesthesia. CONTRAST:  150 mL of Omnipaque 300 milligram/mL FLUOROSCOPY TIME:  Fluoroscopy Time: 65 minutes 6 seconds (2,666 mGy). COMPLICATIONS: SIR LEVEL B - Normal therapy, includes overnight admission for observation. TECHNIQUE: Informed written consent was obtained from the patient after a thorough discussion of the procedural risks,  benefits and alternatives. All questions were addressed. Maximal Sterile Barrier Technique was utilized including caps, mask, sterile gowns, sterile gloves, sterile drape, hand hygiene and skin antiseptic. A timeout was performed prior to the initiation of the procedure. The right groin was prepped and draped in the usual sterile fashion. Using a micropuncture kit and the modified Seldinger technique, access was gained to the right common femoral artery and an 8 French sheath was placed. Real-time ultrasound guidance was utilized for vascular access including the acquisition of a permanent ultrasound image documenting patency of the accessed vessel. Under fluoroscopy, a Zoom 88 guide catheter was navigated over a 6 [place] Berenstein 2 catheter and a 0.035" Terumo Glidewire into the aortic arch. The catheter was placed into the left subclavian artery and then advanced into the left vertebral artery. The diagnostic catheter was removed. Frontal and lateral angiograms of the head were obtained. FINDINGS: 1. Normal caliber of the right common femoral artery, adequate for vascular access. 2. 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 PROCEDURE: Under biplane roadmap, a zoom 71 aspiration catheter was navigated into the left vertebral artery. The aspiration catheter connected to an aspiration pump then advanced towards the level of  occlusion in the left vertebral artery. The catheter was noted to herniate into the left PICA and, therefore, the catheter was retracted. Frontal and lateral angiograms of the head were obtained in use at biplane roadmap. Then, coaxial navigation of a phenom 21 was performed over an Aristotle 14 micro guidewire into the intracranial left vertebral artery. The catheter was navigated over the wire into the vertebrobasilar junction. Frontal lateral angiograms were obtained via microcatheter contrast injection. Contrast was seen within distal bilateral vertebral arteries. No progression of contrast into the basilar artery noted. Further navigation of the catheter into the basilar artery was obtained without difficulty. However, non progression of the wire into the expected PCAs location was seen. Magnified frontal and lateral angiograms were obtained via microcatheter contrast injection. Contrast opacification was seen to be along the anterior wall of the basilar artery with flame shaped distally and no opacification of the posterior cerebral artery. Findings are consistent with dissection. Flat panel CT of the head was obtained and post processed in a separate workstation with concurrent attending physician supervision. Selected images were sent to PACS. No evidence of contrast extravasation noted. The microcatheter was retracted into the proximal intradural left vertebral artery. Frontal and lateral angiograms of the neck were obtained showing contrast opacification along the the false lumen in the basilar artery. Multiple attempts performed to gain access to the true lumen of the basilar artery proved unsuccessful. The microcatheter was subsequently withdrawn into the guide catheter. Flat panel CT of the head was obtained and post processed in a separate workstation with concurrent attending physician supervision. Selected images were sent to PACS. No evidence of contrast extravasation after removal of microcatheter  from false lumen. Follow-up left vertebral artery angiograms showed stable findings. The meantime the catheter was subsequently withdrawn. A right common femoral artery angiogram showed access at the level of the deep artery of the thigh with high femoral artery bifurcation. Under fluoroscopy, a Zoom 88 guide catheter was navigated over a 6 [place] Berenstein 2 catheter and a 0.035" Terumo Glidewire into the aortic arch. The catheter was placed into the right subclavian artery and then advanced into the right vertebral artery. The diagnostic catheter was removed. Frontal and lateral angiograms of the head were obtained. The V2 segment of the  right vertebral artery ends at the distal V2 segment as a muscular branch with no connection visible to the basilar artery. The catheter was subsequently advanced over the wire and under biplane roadmap into the right internal carotid artery. Frontal and lateral angiograms of the head were obtained. There is brisk contrast opacification of the right ACA and MCA vascular trees with delayed opacification of the left PCA an SCA primarily via leptomeningeal collaterals. No large posterior communicating artery identified. Under roadmap guidance, the catheter was then navigated into the left internal carotid artery. Frontal and lateral angiograms of the head were obtained. There is brisk contrast opacification of the left ACA and MCA vascular tree with opacification of the basilar tip and left PCA vascular tree seen via diminutive posterior communicating artery and leptomeningeal collaterals. Next, the catheter was again advanced into the left vertebral artery. Frontal and lateral angiograms of the head were obtained and used as biplane roadmap. Then, triaxial navigation of a phenom 21 was performed through a zoom 71 and over an Aristotle 14 micro guidewire into the intracranial left vertebral artery. Attempt to advance the microcatheter into the true lumen resulted in herniation into  the false lumen. The microcatheter was then left in the false lumen as an attempt to block this pathway. Subsequently, a Prowler microcatheter was navigated over a synchro 2 micro guidewire into the left vertebral artery. Attempt to gain access to the true lumen proved unsuccessful the catheter eventually herniating into the false lumen. Both catheters were subsequently withdrawn. 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 Flow to the PICA appear preserved. Interval system was subsequently withdrawn. Then, a Perclose pro style was utilized for access closure. Immediate hemostasis was achieved. 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 Multiple attempts to gain access to the true lumen proved unsuccessful and, therefore, procedure was aborted. PLAN: Patient will be transferred to MRI to evaluate stroke and vessel patency. Further management should be decided after MRI in conjunction with the neurology team. Family updated by telephone. Electronically Signed   By: Pedro Earls M.D.   On: 02/20/2021 14:43   DG CHEST PORT 1 VIEW  Result Date: 02/07/2021 CLINICAL DATA:  Intubated EXAM: PORTABLE CHEST 1 VIEW COMPARISON:  02/25/2021 FINDINGS: Single frontal view of the chest demonstrates endotracheal tube overlying tracheal air column tip just below thoracic inlet. Cardiac silhouette is unremarkable. No airspace disease, effusion, or pneumothorax. Minimal hypoventilatory changes left base. No acute bony abnormalities. IMPRESSION: 1. No complication after intubation.  No acute process. Electronically Signed   By: Randa Ngo M.D.   On: 02/10/2021 15:00   DG Abd Portable 1V  Result Date: 02/16/2021 CLINICAL DATA:  Enteric catheter placement EXAM: PORTABLE ABDOMEN - 1 VIEW COMPARISON:  None. FINDINGS: Frontal view of the lower chest and upper  abdomen demonstrates enteric catheter tip and side port projecting over the gastric fundus. Lung bases are clear. Bowel gas pattern is unremarkable. IMPRESSION: 1. Enteric catheter tip projecting over the gastric fundus. Electronically Signed   By: Randa Ngo M.D.   On: 02/06/2021 19:52   IR PERCUTANEOUS ART THROMBECTOMY/INFUSION INTRACRANIAL INC DIAG ANGIO  Result Date: 02/16/2021 INDICATION: 50 year old male with past medical history significant for hypertension hypercholesterolemia presented to outside hospital complaining of headaches. There, he was noted to have slurred speech and facial droop. His last known well was 8 p.m. on 02/07/2021. Head CT showed no large  acute territorial infarct. 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 He was then transferred to our service for a diagnostic cerebral angiogram and mechanical thrombectomy. EXAM: ULTRASOUND-GUIDED VASCULAR ACCESS DIAGNOSTIC CEREBRAL ANGIOGRAM ATTEMPTED BASILAR ARTERY REVASCULARIZATION FLAT PANEL HEAD CT COMPARISON:  CT/CT angiogram of the head and neck February 08, 2021. MEDICATIONS: Refer to anesthesia documentation ANESTHESIA/SEDATION: The procedure was performed under general anesthesia. CONTRAST:  150 mL of Omnipaque 300 milligram/mL FLUOROSCOPY TIME:  Fluoroscopy Time: 65 minutes 6 seconds (2,666 mGy). COMPLICATIONS: SIR LEVEL B - Normal therapy, includes overnight admission for observation. TECHNIQUE: Informed written consent was obtained from the patient after a thorough discussion of the procedural risks, benefits and alternatives. All questions were addressed. Maximal Sterile Barrier Technique was utilized including caps, mask, sterile gowns, sterile gloves, sterile drape, hand hygiene and skin antiseptic. A timeout was performed prior to the initiation of the procedure. The right groin was prepped and draped in the usual sterile fashion. Using a  micropuncture kit and the modified Seldinger technique, access was gained to the right common femoral artery and an 8 French sheath was placed. Real-time ultrasound guidance was utilized for vascular access including the acquisition of a permanent ultrasound image documenting patency of the accessed vessel. Under fluoroscopy, a Zoom 88 guide catheter was navigated over a 6 [place] Berenstein 2 catheter and a 0.035" Terumo Glidewire into the aortic arch. The catheter was placed into the left subclavian artery and then advanced into the left vertebral artery. The diagnostic catheter was removed. Frontal and lateral angiograms of the head were obtained. FINDINGS: 1. Normal caliber of the right common femoral artery, adequate for vascular access. 2. 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 PROCEDURE: Under biplane roadmap, a zoom 71 aspiration catheter was navigated into the left vertebral artery. The aspiration catheter connected to an aspiration pump then advanced towards the level of occlusion in the left vertebral artery. The catheter was noted to herniate into the left PICA and, therefore, the catheter was retracted. Frontal and lateral angiograms of the head were obtained in use at biplane roadmap. Then, coaxial navigation of a phenom 21 was performed over an Aristotle 14 micro guidewire into the intracranial left vertebral artery. The catheter was navigated over the wire into the vertebrobasilar junction. Frontal lateral angiograms were obtained via microcatheter contrast injection. Contrast was seen within distal bilateral vertebral arteries. No progression of contrast into the basilar artery noted. Further navigation of the catheter into the basilar artery was obtained without difficulty. However, non progression of the wire into the expected PCAs location was seen. Magnified frontal and lateral angiograms were obtained via microcatheter contrast injection. Contrast opacification  was seen to be along the anterior wall of the basilar artery with flame shaped distally and no opacification of the posterior cerebral artery. Findings are consistent with dissection. Flat panel CT of the head was obtained and post processed in a separate workstation with concurrent attending physician supervision. Selected images were sent to PACS. No evidence of contrast extravasation noted. The microcatheter was retracted into the proximal intradural left vertebral artery. Frontal and lateral angiograms of the neck were obtained showing contrast opacification along the the false lumen in the basilar artery. Multiple attempts performed to gain access to the true lumen of the basilar artery proved unsuccessful. The microcatheter was subsequently withdrawn into the guide catheter. Flat panel CT of the head was obtained and post processed in a separate  workstation with concurrent attending physician supervision. Selected images were sent to PACS. No evidence of contrast extravasation after removal of microcatheter from false lumen. Follow-up left vertebral artery angiograms showed stable findings. The meantime the catheter was subsequently withdrawn. A right common femoral artery angiogram showed access at the level of the deep artery of the thigh with high femoral artery bifurcation. Under fluoroscopy, a Zoom 88 guide catheter was navigated over a 6 [place] Berenstein 2 catheter and a 0.035" Terumo Glidewire into the aortic arch. The catheter was placed into the right subclavian artery and then advanced into the right vertebral artery. The diagnostic catheter was removed. Frontal and lateral angiograms of the head were obtained. The V2 segment of the right vertebral artery ends at the distal V2 segment as a muscular branch with no connection visible to the basilar artery. The catheter was subsequently advanced over the wire and under biplane roadmap into the right internal carotid artery. Frontal and lateral  angiograms of the head were obtained. There is brisk contrast opacification of the right ACA and MCA vascular trees with delayed opacification of the left PCA an SCA primarily via leptomeningeal collaterals. No large posterior communicating artery identified. Under roadmap guidance, the catheter was then navigated into the left internal carotid artery. Frontal and lateral angiograms of the head were obtained. There is brisk contrast opacification of the left ACA and MCA vascular tree with opacification of the basilar tip and left PCA vascular tree seen via diminutive posterior communicating artery and leptomeningeal collaterals. Next, the catheter was again advanced into the left vertebral artery. Frontal and lateral angiograms of the head were obtained and used as biplane roadmap. Then, triaxial navigation of a phenom 21 was performed through a zoom 71 and over an Aristotle 14 micro guidewire into the intracranial left vertebral artery. Attempt to advance the microcatheter into the true lumen resulted in herniation into the false lumen. The microcatheter was then left in the false lumen as an attempt to block this pathway. Subsequently, a Prowler microcatheter was navigated over a synchro 2 micro guidewire into the left vertebral artery. Attempt to gain access to the true lumen proved unsuccessful the catheter eventually herniating into the false lumen. Both catheters were subsequently withdrawn. 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 Flow to the PICA appear preserved. Interval system was subsequently withdrawn. Then, a Perclose pro style was utilized for access closure. Immediate hemostasis was achieved. 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 Multiple attempts to gain access to the true lumen proved unsuccessful and, therefore, procedure was aborted. PLAN:  Patient will be transferred to MRI to evaluate stroke and vessel patency. Further management should be decided after MRI in conjunction with the neurology team. Family updated by telephone. Electronically Signed   By: Pedro Earls M.D.   On: 02/22/2021 14:43   IR ANGIO INTRA EXTRACRAN SEL INTERNAL CAROTID BILAT MOD SED  Result Date: 01/29/2021 INDICATION: 50 year old male with past medical history significant for hypertension hypercholesterolemia presented to outside hospital complaining of headaches. There, he was noted to have slurred speech and facial droop. His last known well was 8 p.m. on 02/07/2021. Head CT showed no large acute territorial infarct. 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 He was then transferred to our service for a diagnostic cerebral angiogram and mechanical thrombectomy. EXAM: ULTRASOUND-GUIDED VASCULAR ACCESS DIAGNOSTIC  CEREBRAL ANGIOGRAM ATTEMPTED BASILAR ARTERY REVASCULARIZATION FLAT PANEL HEAD CT COMPARISON:  CT/CT angiogram of the head and neck February 08, 2021. MEDICATIONS: Refer to anesthesia documentation ANESTHESIA/SEDATION: The procedure was performed under general anesthesia. CONTRAST:  150 mL of Omnipaque 300 milligram/mL FLUOROSCOPY TIME:  Fluoroscopy Time: 65 minutes 6 seconds (2,666 mGy). COMPLICATIONS: SIR LEVEL B - Normal therapy, includes overnight admission for observation. TECHNIQUE: Informed written consent was obtained from the patient after a thorough discussion of the procedural risks, benefits and alternatives. All questions were addressed. Maximal Sterile Barrier Technique was utilized including caps, mask, sterile gowns, sterile gloves, sterile drape, hand hygiene and skin antiseptic. A timeout was performed prior to the initiation of the procedure. The right groin was prepped and draped in the usual sterile fashion. Using a micropuncture kit and the modified  Seldinger technique, access was gained to the right common femoral artery and an 8 French sheath was placed. Real-time ultrasound guidance was utilized for vascular access including the acquisition of a permanent ultrasound image documenting patency of the accessed vessel. Under fluoroscopy, a Zoom 88 guide catheter was navigated over a 6 [place] Berenstein 2 catheter and a 0.035" Terumo Glidewire into the aortic arch. The catheter was placed into the left subclavian artery and then advanced into the left vertebral artery. The diagnostic catheter was removed. Frontal and lateral angiograms of the head were obtained. FINDINGS: 1. Normal caliber of the right common femoral artery, adequate for vascular access. 2. 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 PROCEDURE: Under biplane roadmap, a zoom 71 aspiration catheter was navigated into the left vertebral artery. The aspiration catheter connected to an aspiration pump then advanced towards the level of occlusion in the left vertebral artery. The catheter was noted to herniate into the left PICA and, therefore, the catheter was retracted. Frontal and lateral angiograms of the head were obtained in use at biplane roadmap. Then, coaxial navigation of a phenom 21 was performed over an Aristotle 14 micro guidewire into the intracranial left vertebral artery. The catheter was navigated over the wire into the vertebrobasilar junction. Frontal lateral angiograms were obtained via microcatheter contrast injection. Contrast was seen within distal bilateral vertebral arteries. No progression of contrast into the basilar artery noted. Further navigation of the catheter into the basilar artery was obtained without difficulty. However, non progression of the wire into the expected PCAs location was seen. Magnified frontal and lateral angiograms were obtained via microcatheter contrast injection. Contrast opacification was seen to be along the anterior  wall of the basilar artery with flame shaped distally and no opacification of the posterior cerebral artery. Findings are consistent with dissection. Flat panel CT of the head was obtained and post processed in a separate workstation with concurrent attending physician supervision. Selected images were sent to PACS. No evidence of contrast extravasation noted. The microcatheter was retracted into the proximal intradural left vertebral artery. Frontal and lateral angiograms of the neck were obtained showing contrast opacification along the the false lumen in the basilar artery. Multiple attempts performed to gain access to the true lumen of the basilar artery proved unsuccessful. The microcatheter was subsequently withdrawn into the guide catheter. Flat panel CT of the head was obtained and post processed in a separate workstation with concurrent attending physician supervision. Selected images were sent to PACS. No evidence of contrast extravasation after removal of microcatheter from false lumen. Follow-up left vertebral artery angiograms showed stable findings. The meantime the catheter was subsequently withdrawn. A  right common femoral artery angiogram showed access at the level of the deep artery of the thigh with high femoral artery bifurcation. Under fluoroscopy, a Zoom 88 guide catheter was navigated over a 6 [place] Berenstein 2 catheter and a 0.035" Terumo Glidewire into the aortic arch. The catheter was placed into the right subclavian artery and then advanced into the right vertebral artery. The diagnostic catheter was removed. Frontal and lateral angiograms of the head were obtained. The V2 segment of the right vertebral artery ends at the distal V2 segment as a muscular branch with no connection visible to the basilar artery. The catheter was subsequently advanced over the wire and under biplane roadmap into the right internal carotid artery. Frontal and lateral angiograms of the head were obtained.  There is brisk contrast opacification of the right ACA and MCA vascular trees with delayed opacification of the left PCA an SCA primarily via leptomeningeal collaterals. No large posterior communicating artery identified. Under roadmap guidance, the catheter was then navigated into the left internal carotid artery. Frontal and lateral angiograms of the head were obtained. There is brisk contrast opacification of the left ACA and MCA vascular tree with opacification of the basilar tip and left PCA vascular tree seen via diminutive posterior communicating artery and leptomeningeal collaterals. Next, the catheter was again advanced into the left vertebral artery. Frontal and lateral angiograms of the head were obtained and used as biplane roadmap. Then, triaxial navigation of a phenom 21 was performed through a zoom 71 and over an Aristotle 14 micro guidewire into the intracranial left vertebral artery. Attempt to advance the microcatheter into the true lumen resulted in herniation into the false lumen. The microcatheter was then left in the false lumen as an attempt to block this pathway. Subsequently, a Prowler microcatheter was navigated over a synchro 2 micro guidewire into the left vertebral artery. Attempt to gain access to the true lumen proved unsuccessful the catheter eventually herniating into the false lumen. Both catheters were subsequently withdrawn. 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 Flow to the PICA appear preserved. Interval system was subsequently withdrawn. Then, a Perclose pro style was utilized for access closure. Immediate hemostasis was achieved. 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 Multiple attempts to gain access to the true lumen proved unsuccessful and, therefore, procedure was aborted. PLAN: Patient will be transferred to MRI to  evaluate stroke and vessel patency. Further management should be decided after MRI in conjunction with the neurology team. Family updated by telephone. Electronically Signed   By: Pedro Earls M.D.   On: 02/09/2021 14:43   IR ANGIO VERTEBRAL SEL VERTEBRAL UNI R MOD SED  Result Date: 02/17/2021 INDICATION: 50 year old male with past medical history significant for hypertension hypercholesterolemia presented to outside hospital complaining of headaches. There, he was noted to have slurred speech and facial droop. His last known well was 8 p.m. on 02/07/2021. Head CT showed no large acute territorial infarct. 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 He was then transferred to our service for a diagnostic cerebral angiogram and mechanical thrombectomy. EXAM: ULTRASOUND-GUIDED VASCULAR ACCESS DIAGNOSTIC CEREBRAL ANGIOGRAM ATTEMPTED BASILAR ARTERY REVASCULARIZATION FLAT PANEL HEAD CT COMPARISON:  CT/CT angiogram of the head and neck February 08, 2021. MEDICATIONS: Refer to anesthesia documentation ANESTHESIA/SEDATION: The procedure was performed under general anesthesia. CONTRAST:  150 mL of Omnipaque  300 milligram/mL FLUOROSCOPY TIME:  Fluoroscopy Time: 65 minutes 6 seconds (2,666 mGy). COMPLICATIONS: SIR LEVEL B - Normal therapy, includes overnight admission for observation. TECHNIQUE: Informed written consent was obtained from the patient after a thorough discussion of the procedural risks, benefits and alternatives. All questions were addressed. Maximal Sterile Barrier Technique was utilized including caps, mask, sterile gowns, sterile gloves, sterile drape, hand hygiene and skin antiseptic. A timeout was performed prior to the initiation of the procedure. The right groin was prepped and draped in the usual sterile fashion. Using a micropuncture kit and the modified Seldinger technique, access was gained to the right  common femoral artery and an 8 French sheath was placed. Real-time ultrasound guidance was utilized for vascular access including the acquisition of a permanent ultrasound image documenting patency of the accessed vessel. Under fluoroscopy, a Zoom 88 guide catheter was navigated over a 6 [place] Berenstein 2 catheter and a 0.035" Terumo Glidewire into the aortic arch. The catheter was placed into the left subclavian artery and then advanced into the left vertebral artery. The diagnostic catheter was removed. Frontal and lateral angiograms of the head were obtained. FINDINGS: 1. Normal caliber of the right common femoral artery, adequate for vascular access. 2. 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 PROCEDURE: Under biplane roadmap, a zoom 71 aspiration catheter was navigated into the left vertebral artery. The aspiration catheter connected to an aspiration pump then advanced towards the level of occlusion in the left vertebral artery. The catheter was noted to herniate into the left PICA and, therefore, the catheter was retracted. Frontal and lateral angiograms of the head were obtained in use at biplane roadmap. Then, coaxial navigation of a phenom 21 was performed over an Aristotle 14 micro guidewire into the intracranial left vertebral artery. The catheter was navigated over the wire into the vertebrobasilar junction. Frontal lateral angiograms were obtained via microcatheter contrast injection. Contrast was seen within distal bilateral vertebral arteries. No progression of contrast into the basilar artery noted. Further navigation of the catheter into the basilar artery was obtained without difficulty. However, non progression of the wire into the expected PCAs location was seen. Magnified frontal and lateral angiograms were obtained via microcatheter contrast injection. Contrast opacification was seen to be along the anterior wall of the basilar artery with flame shaped distally  and no opacification of the posterior cerebral artery. Findings are consistent with dissection. Flat panel CT of the head was obtained and post processed in a separate workstation with concurrent attending physician supervision. Selected images were sent to PACS. No evidence of contrast extravasation noted. The microcatheter was retracted into the proximal intradural left vertebral artery. Frontal and lateral angiograms of the neck were obtained showing contrast opacification along the the false lumen in the basilar artery. Multiple attempts performed to gain access to the true lumen of the basilar artery proved unsuccessful. The microcatheter was subsequently withdrawn into the guide catheter. Flat panel CT of the head was obtained and post processed in a separate workstation with concurrent attending physician supervision. Selected images were sent to PACS. No evidence of contrast extravasation after removal of microcatheter from false lumen. Follow-up left vertebral artery angiograms showed stable findings. The meantime the catheter was subsequently withdrawn. A right common femoral artery angiogram showed access at the level of the deep artery of the thigh with high femoral artery bifurcation. Under fluoroscopy, a Zoom 88 guide catheter was navigated over a 6 [place] Berenstein 2 catheter and a 0.035"  Terumo Glidewire into the aortic arch. The catheter was placed into the right subclavian artery and then advanced into the right vertebral artery. The diagnostic catheter was removed. Frontal and lateral angiograms of the head were obtained. The V2 segment of the right vertebral artery ends at the distal V2 segment as a muscular branch with no connection visible to the basilar artery. The catheter was subsequently advanced over the wire and under biplane roadmap into the right internal carotid artery. Frontal and lateral angiograms of the head were obtained. There is brisk contrast opacification of the right ACA and  MCA vascular trees with delayed opacification of the left PCA an SCA primarily via leptomeningeal collaterals. No large posterior communicating artery identified. Under roadmap guidance, the catheter was then navigated into the left internal carotid artery. Frontal and lateral angiograms of the head were obtained. There is brisk contrast opacification of the left ACA and MCA vascular tree with opacification of the basilar tip and left PCA vascular tree seen via diminutive posterior communicating artery and leptomeningeal collaterals. Next, the catheter was again advanced into the left vertebral artery. Frontal and lateral angiograms of the head were obtained and used as biplane roadmap. Then, triaxial navigation of a phenom 21 was performed through a zoom 71 and over an Aristotle 14 micro guidewire into the intracranial left vertebral artery. Attempt to advance the microcatheter into the true lumen resulted in herniation into the false lumen. The microcatheter was then left in the false lumen as an attempt to block this pathway. Subsequently, a Prowler microcatheter was navigated over a synchro 2 micro guidewire into the left vertebral artery. Attempt to gain access to the true lumen proved unsuccessful the catheter eventually herniating into the false lumen. Both catheters were subsequently withdrawn. 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 Flow to the PICA appear preserved. Interval system was subsequently withdrawn. Then, a Perclose pro style was utilized for access closure. Immediate hemostasis was achieved. 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 Multiple attempts to gain access to the true lumen proved unsuccessful and, therefore, procedure was aborted. PLAN: Patient will be transferred to MRI to evaluate stroke and vessel patency. Further management should  be decided after MRI in conjunction with the neurology team. Family updated by telephone. Electronically Signed   By: Pedro Earls M.D.   On: 02/07/2021 14:43    Labs:  CBC: Recent Labs    02/25/2021 1345 02/09/2021 1347 02/09/21 0629  WBC 10.8*  --  13.6*  HGB 15.8 15.0 14.8  HCT 45.6 44.0 42.8  PLT 246  --  247    COAGS: No results for input(s): INR, APTT in the last 8760 hours.  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  Still sedated/intubated.  Will continue to follow after extubation for re-evaluation.   Electronically Signed: Pasty Spillers, PA 02/09/2021, 9:25 AM   I spent a total of 15 Minutes at the the patient's bedside AND on the patient's hospital floor or unit, greater than 50% of which was counseling/coordinating care for stroke.

## 2021-02-09 NOTE — Progress Notes (Signed)
NAME:  Richard Oneal, MRN:  [id number], DOB:  [birth year], LOS: 1 ADMISSION DATE:  02/02/2021, CONSULTATION DATE:  02/09/21 REFERRING MD:  Wilford Corner , CHIEF COMPLAINT:  slurred speech   History of Present Illness:  Richard Oneal is a 50 y.o. M with PMH of HL who presented to [facility] ED for headache.  He was noted to have slurred speech and code stroke was activated.  Stroke work-up was significant for bilateral vertebral artery and basilar artery occlusion and pt was transferred to [facility] for revascularization.  LKW was the night before presentation, so was outside the window for TPA.    On arrival to [facility] was taken to IR, however were not able to gain access to the true lumen of the L vertebral artery and developed collaterals in the bilateral PCA's suggested possible chronic occlusion, therefore the procedure was aborted.  Pt left intubated and MRI ordered. PCCM consulted for ventilator management.   Pertinent  Medical History   has a past medical history of Dental caries and Hypercholesterolemia.   Significant Hospital Events: Including procedures, antibiotic start and stop dates in addition to other pertinent events   12/13 Transferred from [place] to [facility], thrombectomy unsuccessful, left intubated pending MRI and PCCM consult for vent management, on Cleviprex 12/14 No overnight events, plan for SBT/SAT   Interim History / Subjective:   Stable overnight, spoke with family who noted pt had cognitive deficits and asked that they be present for extubation  Objective   Blood pressure (!) 144/75, pulse 93, temperature 99 F (37.2 C), temperature source Oral, resp. rate (!) 21, height 5\' 6"  (1.676 m), weight 80 kg, SpO2 98 %.    Vent Mode: PRVC FiO2 (%):  [40 %] 40 % Set Rate:  [16 bmp] 16 bmp Vt Set:  [550 mL] 550 mL PEEP:  [5 cmH20] 5 cmH20 Plateau Pressure:  [12 cmH20-16 cmH20] 15 cmH20   Intake/Output Summary (Last 24 hours) at 02/09/2021 02/10/2021 Last data filed at  02/09/2021 0700 Gross per 24 hour  Intake 1594.23 ml  Output 350 ml  Net 1244.23 ml    Filed Weights   01/29/2021 1328  Weight: 80 kg    General:  well-nourished M, intubated and sedated HEENT: MM pink/moist, ETT in place, sclera anicteric Neuro: RASS -4 on propofol, pupils equal, triggering vent CV: s1s2 rrr, no m/r/g PULM:  Mechanic breath sounds bilaterally, minimal vent setting, no rhonchi or wheezing GI: soft, bsx4 active  Extremities: warm/dry, no edema  Skin: no rashes or lesions    Resolved Hospital Problem list     Assessment & Plan:   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 Mild edema, punctate infarct R parietal area  P: -SBT/SAT this AM -Neurology primary team, admit to ICU for frequent neuro checks, continue antiplatelet therapy, echo -BP goal 120-140, currently requiring Cleviprex gtt -Maintain full vent support with SAT/SBT as tolerated -titrate Vent setting to maintain SpO2 greater than or equal to 90%. -HOB elevated 30 degrees. -Plateau pressures less than 30 cm H20.  -Follow chest x-ray, ABG prn.   -Bronchial hygiene and RT/bronchodilator protocol.    Best  Practice (right click and "Reselect all SmartList Selections" daily)   Diet/type: NPO DVT prophylaxis: SCD GI prophylaxis: PPI Lines: N/A Foley:  N/A Code Status:  full code Last date of multidisciplinary goals of care discussion [family updated at the bedside 12/13, spoke to sister 12/14 ]  Labs   CBC: Recent Labs  Lab 02/03/2021 1345  1347   WBC 10.8*  --   HGB 15.8 15.0  HCT 45.6 44.0  MCV 88.9  --   PLT 246  --     Basic Metabolic Panel: Recent Labs  Lab 02/23/2021 1345 02/16/2021 1347  NA 139 139  K 3.5 3.4*  CL 106  --   CO2 23  --   GLUCOSE 130*  --   BUN 9  --   CREATININE 0.74  --   CALCIUM 9.1  --    GFR: Estimated Creatinine Clearance: 109.8 mL/min (by C-G formula based on SCr of 0.74 mg/dL). Recent Labs  Lab 01/31/2021 1345  WBC 10.8*    Liver Function Tests: Recent Labs  Lab 02/09/2021 1345  AST 17  ALT 18  ALKPHOS 55  BILITOT 0.9  PROT 7.5  ALBUMIN 4.6   No results for input(s): LIPASE, AMYLASE in the last 168 hours. No results for input(s): AMMONIA in the last 168 hours.  ABG    Component Value Date/Time   PHART 7.373 02/07/2021 1347   PCO2ART 42.8 02/02/2021 1347   PO2ART 131 (H) 02/12/2021 1347   HCO3 24.9 02/20/2021 1347   TCO2 26 01/27/2021 1347   ACIDBASEDEF 1.0 02/01/2021 1347   O2SAT 99.0 02/06/2021 1347     Coagulation Profile: No results for input(s): INR, PROTIME in the last 168 hours.  Cardiac Enzymes: No results for input(s): CKTOTAL, CKMB, CKMBINDEX, TROPONINI in the last 168 hours.  HbA1C: Hgb A1c MFr Bld  Date/Time Value Ref Range Status  02/09/2021 06:29 AM 4.9 4.8 - 5.6 % Final    Comment:    (NOTE) Pre diabetes:          5.7%-6.4%  Diabetes:              >6.4%  Glycemic control for   <7.0% adults with diabetes     CBG: Recent Labs  Lab 02/02/2021 1534 02/22/2021 1929 02/09/2021 2327 02/09/21 0330  GLUCAP 153* 149* 128* 116*    Review of Systems:   Unable to obtain secondary to intubation  Past Medical History:  He,  has a past medical history of Dental caries and Hypercholesterolemia.   Surgical History:   Past Surgical History:  Procedure Laterality Date   dental extractions     IR ANGIO INTRA EXTRACRAN SEL INTERNAL CAROTID BILAT MOD SED  02/05/2021   IR ANGIO VERTEBRAL SEL VERTEBRAL UNI R MOD SED  02/25/2021   IR CT HEAD LTD   02/06/2021   IR CT HEAD LTD  02/16/2021   IR PERCUTANEOUS ART THROMBECTOMY/INFUSION INTRACRANIAL INC DIAG ANGIO  02/02/2021   IR US GUIDE VASC ACCESS RIGHT  02/24/2021   MULTIPLE EXTRACTIONS WITH ALVEOLOPLASTY N/A 07/27/2017   Procedure: EXTRACTION teeth 2,3,4,5,6,12,15,19,20,21,22,27,28,29 WITH ALVEOLOPLASTY;  Surgeon: Ocie Doyne, DDS;  [facility];  Service: Oral Surgery;  Laterality: N/A;   RADIOLOGY WITH ANESTHESIA N/A 02/21/2021   Procedure: IR WITH ANESTHESIA;  Surgeon: Radiologist, Medication, MD;  Location: [facility] OR;  Service: Radiology;  Laterality: N/A;     Social History:   reports that he has been smoking cigars. He has never used smokeless tobacco. He reports that  he does not currently use alcohol. He reports that he does not currently use drugs.   Family History:  His family history includes Heart attack in his mother.   Allergies No Known Allergies   Home Medications  Prior to Admission medications   Medication Sig Start Date End Date Taking? Authorizing Provider  amoxicillin (AMOXIL) 500 MG capsule Take 1 capsule (500 mg total) by mouth 3 (three) times daily. 07/27/17   Ocie Doyne, DMD  atorvastatin (LIPITOR) 20 MG tablet Take 20 mg by mouth daily.    [provider]  hydrOXYzine (VISTARIL) 25 MG capsule Take 50 mg by mouth 2 (two) times daily. 01/26/21   [provider]  levothyroxine (SYNTHROID) 25 MCG tablet Take 25 mcg by mouth daily. 01/06/21   [provider]  oxyCODONE-acetaminophen (PERCOCET) 5-325 MG tablet Take 1 tablet by mouth every 4 (four) hours as needed. 07/27/17   Ocie Doyne, DMD  rosuvastatin (CRESTOR) 10 MG tablet Take 10 mg by mouth at bedtime. 01/06/21   [provider]     Critical care time:  30 minutes     CRITICAL CARE Performed by: Darcella Gasman Corinda Ammon   Total critical care time: 30 minutes  Critical care time was exclusive of separately billable procedures and treating other patients.  Critical  care was necessary to treat or prevent imminent or life-threatening deterioration.  Critical care was time spent personally by me on the following activities: development of treatment plan with patient and/or surrogate as well as nursing, discussions with consultants, evaluation of patient's response to treatment, examination of patient, obtaining history from patient or surrogate, ordering and performing treatments and interventions, ordering and review of laboratory studies, ordering and review of radiographic studies, pulse oximetry and re-evaluation of patient's condition.  Darcella Gasman Shae Augello, PA-C [facility] See Amion for pager If no response to pager , please call [phone] until 7pm After 7:00 pm call Elink  [phone]

## 2021-02-09 NOTE — Progress Notes (Addendum)
STROKE TEAM PROGRESS NOTE   ATTENDING NOTE: I reviewed above note and agree with the assessment and plan. Pt was seen and examined.   50 year old male with history of hyperlipidemia, speech disorder, anxiety admitted for headache, left-sided numbness and imbalance gait with slurred speech.  CT negative.  CTA head and neck OSH showed bilateral VA and proximal BA occlusion.  IR attempted, not successful, due to likely chronic b/ VA and BA occlusion.  However, not sure if acute left VA dissection during procedure or just chronic false lumen.  MRI showed bilateral cerebellum infarct, left more than right, pontine and left thalamic infarcts.  MRA unchanged from CTA head and neck.  EF 65 to 70%.  A1c 4.9, LDL 54, THC positive.  Creatinine 1.03, WBC 13.6.  On exam, patient aunt and sister are at bedside, patient still intubated on sedation, eyes closed, not following commands. With forced eye opening, eyes in need position, not blinking to visual threat, doll's eyes present, not tracking, PERRL. Corneal reflex present, gag and cough present. Breathing over the vent.  Facial symmetry not able to test due to ET tube.  Tongue protrusion not cooperative. On pain stimulation, not moving all extremities. No babinski. Sensation, coordination and gait not tested.  Etiology for patient stroke likely due to chronic occlusion of posterior circulation.  Although collateral flow developed over time, however is not adequate.  Per family, patient eats poorly, but no smoking or alcohol use.  Denies head or neck trauma.  Continue aspirin 325 and Crestor 20 at this time.  BP goal 120-160, taper off Cleviprex as able.  Vent management per CCM, plan to extubate today if possible.  Continue IV fluid.  For detailed assessment and plan, please refer to above as I have made changes wherever appropriate.   Marvel Plan, MD PhD Stroke Neurology 02/09/2021 6:59 PM  This patient is critically ill due to poor circulation strokes,  posterior circulation occlusion, status post unsuccessful IR, respite failure and at significant risk of neurological worsening, death form recurrent stroke, hemorrhagic conversion, aspiration. This patient's care requires constant monitoring of vital signs, hemodynamics, respiratory and cardiac monitoring, review of multiple databases, neurological assessment, discussion with family, other specialists and medical decision making of high complexity. I spent 40 minutes of neurocritical care time in the care of this patient. I had long discussion with aunt and sister at bedside, updated pt current condition, treatment plan and potential prognosis, and answered all the questions.  They expressed understanding and appreciation.      INTERVAL HISTORY Patient is seen in room with his aunt and sister at the bedside.  He was admitted yesterday with symptoms of headache, left-sided numbness, gait imbalance and slurred speech.  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  This procedure was unfortunately unsuccessful.  Patient remained intubated after the procedure and was admitted to the ICU.  Vitals:   02/09/21 1215 02/09/21 1230 02/09/21 1245 02/09/21 1300  BP: (!) 143/76 140/76 (!) 141/75 140/76  Pulse: 83 84 83 81  Resp: 17 17 18 17   Temp:      TempSrc:      SpO2: 98% 98% 98% 98%  Weight:      Height:       CBC:  Recent Labs  Lab Feb 24, 2021 1345 02-24-2021 1347 02/09/21 0629  WBC 10.8*  --  13.6*  NEUTROABS  --   --  10.5*  HGB 15.8 15.0 14.8  HCT 45.6 44.0 42.8  MCV 88.9  --  90.1  PLT 246  --  247   Basic Metabolic Panel:  Recent Labs  Lab 02/17/2021 1345 02/07/2021 1347 02/09/21 0629  NA 139 139 134*  K 3.5 3.4* 3.3*  CL 106  --  102  CO2 23  --  22  GLUCOSE 130*  --  116*  BUN 9  --  17  CREATININE 0.74  --  1.03  CALCIUM 9.1  --  9.1  MG  --   --  2.0   Lipid Panel:  Recent Labs  Lab 02/09/21 0629   CHOL 157  TRIG 315*   321*  HDL 40*  CHOLHDL 3.9  VLDL 63*  LDLCALC 54   HgbA1c:  Recent Labs  Lab 02/09/21 0629  HGBA1C 4.9   Urine Drug Screen:  Recent Labs  Lab 01/27/2021 1533  LABOPIA NONE DETECTED  COCAINSCRNUR NONE DETECTED  LABBENZ NONE DETECTED  AMPHETMU NONE DETECTED  THCU POSITIVE*  LABBARB NONE DETECTED    Alcohol Level No results for input(s): ETH in the last 168 hours.  IMAGING past 24 hours DG Abd Portable 1V  Result Date: 02/09/2021 CLINICAL DATA:  Vomiting.  On ventilator. EXAM: PORTABLE ABDOMEN - 1 VIEW COMPARISON:  Chest and abdominal radiographs 02/25/2021. FINDINGS: 1416 hours. Enteric tube tip projects to the L1 level, consistent with position in the mid stomach. The visualized bowel gas pattern is normal. The bladder is moderately distended with contrast, attributed to recent CT angiography. No supine evidence of free intraperitoneal air. The bones appear unremarkable. IMPRESSION: No evidence of acute abdominal process. Enteric tube projects to the level of the mid stomach. Electronically Signed   By: Carey Bullocks M.D.   On: 02/09/2021 14:26   DG Abd Portable 1V  Result Date: 02/01/2021 CLINICAL DATA:  Enteric catheter placement EXAM: PORTABLE ABDOMEN - 1 VIEW COMPARISON:  None. FINDINGS: Frontal view of the lower chest and upper abdomen demonstrates enteric catheter tip and side port projecting over the gastric fundus. Lung bases are clear. Bowel gas pattern is unremarkable. IMPRESSION: 1. Enteric catheter tip projecting over the gastric fundus. Electronically Signed   By: Sharlet Salina M.D.   On: 02/12/2021 19:52   ECHOCARDIOGRAM COMPLETE BUBBLE STUDY  Result Date: 02/09/2021    ECHOCARDIOGRAM REPORT   Patient Name:   Richard Oneal Date of Exam: 02/09/2021 Medical Rec #:  [id number]    Height:       66.0 in Accession #:    [id number]   Weight:       176.4 lb Date of Birth:  1970-06-20   BSA:          1.896 m Patient Age:    50 years     BP:            154/80 mmHg Patient Gender: M            HR:           98 bpm. Exam Location:  Inpatient Procedure: 2D Echo, Cardiac Doppler, Color Doppler and Saline Contrast Bubble            Study Indications:    Stroke 434.91 / I63.9  History:        Patient has prior history of Echocardiogram examinations, most                 recent 07/16/2020. Risk Factors:Dyslipidemia.  Sonographer:    Leta Jungling RDCS Referring Phys: [id number] ASHISH [place]  IMPRESSIONS  1. Left ventricular ejection fraction, by estimation, is 65 to 70%. The left ventricle has normal function. The left ventricle has no regional wall motion abnormalities. Left ventricular diastolic parameters were normal.  2. Right ventricular systolic function is normal. The right ventricular size is normal. Tricuspid regurgitation signal is inadequate for assessing PA pressure.  3. The mitral valve is normal in structure. No evidence of mitral valve regurgitation.  4. The aortic valve is tricuspid. Aortic valve regurgitation is not visualized.  5. Cannot exclude a small PFO. Agitated saline contrast bubble study was minimally positive with very small shunting observed within 3-6 cardiac cycles suggestive of interatrial shunt. Comparison(s): No prior Echocardiogram. FINDINGS  Left Ventricle: Left ventricular ejection fraction, by estimation, is 65 to 70%. The left ventricle has normal function. The left ventricle has no regional wall motion abnormalities. The left ventricular internal cavity size was normal in size. There is  no left ventricular hypertrophy. Left ventricular diastolic parameters were normal. The ratio of pulmonic flow to systemic flow (Qp/Qs ratio) is 1.30. Right Ventricle: The right ventricular size is normal. No increase in right ventricular wall thickness. Right ventricular systolic function is normal. Tricuspid regurgitation signal is inadequate for assessing PA pressure. Left Atrium: Left atrial size was normal in size. Right Atrium: Right atrial size  was normal in size. Pericardium: There is no evidence of pericardial effusion. Mitral Valve: The mitral valve is normal in structure. No evidence of mitral valve regurgitation. Tricuspid Valve: The tricuspid valve is normal in structure. Tricuspid valve regurgitation is not demonstrated. No evidence of tricuspid stenosis. Aortic Valve: The aortic valve is tricuspid. Aortic valve regurgitation is not visualized. Pulmonic Valve: The pulmonic valve was grossly normal. Pulmonic valve regurgitation is not visualized. No evidence of pulmonic stenosis. Aorta: The aortic root and ascending aorta are structurally normal, with no evidence of dilitation. IAS/Shunts: The interatrial septum is aneurysmal. Cannot exclude a small PFO. Agitated saline contrast was given intravenously to evaluate for intracardiac shunting. Agitated saline contrast bubble study was positive with shunting observed within 3-6 cardiac cycles suggestive of interatrial shunt. The ratio of pulmonic flow to systemic flow (Qp/Qs ratio) is 1.30.  LEFT VENTRICLE PLAX 2D LVIDd:         4.60 cm   Diastology LVIDs:         2.60 cm   LV e' medial:    8.66 cm/s LV PW:         0.80 cm   LV E/e' medial:  10.3 LV IVS:        1.00 cm   LV e' lateral:   10.58 cm/s LVOT diam:     2.00 cm   LV E/e' lateral: 8.4 LV SV:         64 LV SV Index:   34 LVOT Area:     3.14 cm  RIGHT VENTRICLE RV S prime:     22.10 cm/s RVOT diam:      2.20 cm TAPSE (M-mode): 2.3 cm LEFT ATRIUM             Index       RIGHT ATRIUM           Index LA diam:        2.90 cm 1.53 cm/m  RA Area:     10.50 cm LA Vol (A2C):   16.7 ml 8.81 ml/m  RA Volume:   22.60 ml  11.92 ml/m LA Vol (A4C):   17.6 ml 9.28 ml/m LA  Biplane Vol: 17.3 ml 9.13 ml/m  AORTIC VALVE             PULMONIC VALVE LVOT Vmax:   135.00 cm/s PV Area (Vmax):  2.50 cm LVOT Vmean:  96.700 cm/s PV Area (Vmean): 2.54 cm LVOT VTI:    0.203 m     PV Area (VTI):   2.52 cm                          PV Vmax:         2.07 m/s AORTA                     PV Vmean:        151.000 cm/s Ao Root diam: 3.20 cm    PV VTI:          0.337 m Ao Asc diam:  3.40 cm    PV Peak grad:    17.1 mmHg                          PV Mean grad:    10.0 mmHg                          RVOT Peak grad:  7 mmHg  MITRAL VALVE MV Area (PHT): 3.50 cm    SHUNTS MV Decel Time: 217 msec    Systemic VTI:  0.20 m MV E velocity: 88.90 cm/s  Systemic Diam: 2.00 cm MV A velocity: 88.05 cm/s  Pulmonic VTI:  0.223 m MV E/A ratio:  1.01        Pulmonic Diam: 2.20 cm                            Qp/Qs:         1.33 Riley Lam MD Electronically signed by Riley Lam MD Signature Date/Time: 02/09/2021/3:00:51 PM    Final     PHYSICAL EXAM General: Intubated, well-developed male in no acute distress.  CV: Regular rate and rhythm  Respiratory:  Respirations even and synchronous with ventilator  NEURO:  Patient is somnolent but will open eyes to voice, will blink eyes on command.  Cough and gag present.  PERRL.  Will withdraw LUE to noxious stimulus and flicker RUE.  No response in BLE.   ASSESSMENT/PLAN Richard Oneal is a 50 y.o. male with history of HLD and cognitive impairment presenting with gait imbalance, headache, slurred speech and left-sided numbness.  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  This procedure was unfortunately unsuccessful.  Patient remained intubated after the procedure and was admitted to the ICU.    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  Therapy recommendations:  pending Disposition:  pending  Hypertension Home meds:  none Unstable Requiring Cleviprex Keep SBP 120-160 Long-term BP goal normotensive  Hyperlipidemia Home meds:  rosuvastatin 10 mg daily, resumed in hospital LDL 54, goal < 70 High intensity statin deferred as LDL below goal Continue statin at discharge  Respiratory failure Patient remains intubated after procedure SBT today, hopefully can extubate soon  Other Stroke Risk Factors Substance abuse - UDS:  THC POSITIVE. Patient advised to stop using due to stroke risk. Family hx stroke   Other Active Problems Hx of speech disorder   Hospital day # 1  Cortney E Ernestina Columbia , MSN, AGACNP-BC [facility] See Amion for schedule and pager information 02/09/2021 4:20 PM    To contact Stroke Continuity provider, please refer to [email or web address]. After hours, contact General Neurology

## 2021-02-09 NOTE — Progress Notes (Signed)
PT Cancellation Note  Patient Details Name: Richard Oneal MRN: [id number] DOB: Nov 29, 1970   Cancelled Treatment:    Reason Eval/Treat Not Completed: Patient not medically ready.  Hold per RN. 02/09/2021  Jacinto Halim., PT Acute Rehabilitation Services [phone]  (pager) [phone]  (office)   Richard Oneal Richard Oneal 02/09/2021, 4:19 PM

## 2021-02-09 NOTE — TOC CAGE-AID Note (Signed)
Transition of Care [facility]) - CAGE-AID Screening   Patient Details  Name: Thuan Tippett MRN: [id number] Date of Birth: 1971-01-25  Transition of [facility]) CM/SW Contact:    Erin Sons, LCSW Phone Number: 02/09/2021, 10:02 AM   Clinical Narrative:  Patient unable to participate in CAGE-AID at this time.   CAGE-AID Screening: Substance Abuse Screening unable to be completed due to: : Patient unable to participate

## 2021-02-09 NOTE — Progress Notes (Incomplete)
Initial Nutrition Assessment  DOCUMENTATION CODES:      INTERVENTION:  ***   NUTRITION DIAGNOSIS:     related to   as evidenced by  .  GOAL:      MONITOR:      REASON FOR ASSESSMENT:   Ventilator    ASSESSMENT:      ***  ***No weight history available for review. Weight history reviewed. ***  PO Intake: ***   UOP: ***ml x24 hours I/O: ***ml since admit  Edema: *** per RN assessment  Medications: Scheduled Meds:  [START ON 02/10/2021] aspirin  325 mg Oral Daily   chlorhexidine gluconate (MEDLINE KIT)  15 mL Mouth Rinse BID   Chlorhexidine Gluconate Cloth  6 each Topical Daily   docusate sodium  100 mg Oral BID   mouth rinse  15 mL Mouth Rinse 10 times per day   [START ON 02/10/2021] polyethylene glycol  17 g Oral Daily   [START ON 02/10/2021] rosuvastatin  20 mg Oral Daily   Continuous Infusions:  sodium chloride Stopped (02/09/21 0412)   clevidipine 7 mg/hr (02/09/21 0500)   dexmedetomidine (PRECEDEX) IV infusion     propofol (DIPRIVAN) infusion 50 mcg/kg/min (02/09/21 0823)    Labs: Recent Labs  Lab 02/14/2021 1345 02/02/2021 1347 02/09/21 0629  NA 139 139 134*  K 3.5 3.4* 3.3*  CL 106  --  102  CO2 23  --  22  BUN 9  --  17  CREATININE 0.74  --  1.03  CALCIUM 9.1  --  9.1  MG  --   --  2.0  GLUCOSE 130*  --  116*   CBGs: *** x24 hours    NUTRITION - FOCUSED PHYSICAL EXAM:  {RD Focused Exam List:21252}  Diet Order:   Diet Order             Diet NPO time specified  Diet effective now                   EDUCATION NEEDS:      Skin:     Last BM:     Height:   Ht Readings from Last 1 Encounters:  02/25/2021 5' 6"  (1.676 m)    Weight:   Wt Readings from Last 1 Encounters:  02/16/2021 80 kg    Ideal Body Weight:     BMI:  Body mass index is 28.47 kg/m.  Estimated Nutritional Needs:   Kcal:     Protein:     Fluid:       ***

## 2021-02-10 ENCOUNTER — Inpatient Hospital Stay (HOSPITAL_COMMUNITY): Payer: Medicaid Other

## 2021-02-10 DIAGNOSIS — R112 Nausea with vomiting, unspecified: Secondary | ICD-10-CM

## 2021-02-10 DIAGNOSIS — I63233 Cerebral infarction due to unspecified occlusion or stenosis of bilateral carotid arteries: Principal | ICD-10-CM

## 2021-02-10 DIAGNOSIS — Z66 Do not resuscitate: Secondary | ICD-10-CM | POA: Diagnosis not present

## 2021-02-10 DIAGNOSIS — G9382 Brain death: Secondary | ICD-10-CM | POA: Diagnosis not present

## 2021-02-10 DIAGNOSIS — G935 Compression of brain: Secondary | ICD-10-CM

## 2021-02-10 DIAGNOSIS — Z515 Encounter for palliative care: Secondary | ICD-10-CM | POA: Diagnosis not present

## 2021-02-10 DIAGNOSIS — G911 Obstructive hydrocephalus: Secondary | ICD-10-CM

## 2021-02-10 LAB — COMPREHENSIVE METABOLIC PANEL
ALT: UNDETERMINED U/L (ref 0–44)
AST: 29 U/L (ref 15–41)
Albumin: 3.8 g/dL (ref 3.5–5.0)
Alkaline Phosphatase: 47 U/L (ref 38–126)
Anion gap: 12 (ref 5–15)
BUN: 21 mg/dL — ABNORMAL HIGH (ref 6–20)
CO2: 19 mmol/L — ABNORMAL LOW (ref 22–32)
Calcium: 8.9 mg/dL (ref 8.9–10.3)
Chloride: 107 mmol/L (ref 98–111)
Creatinine, Ser: 1.01 mg/dL (ref 0.61–1.24)
GFR, Estimated: >60 mL/min (ref >60)
Glucose, Bld: 131 mg/dL — ABNORMAL HIGH (ref 70–99)
Potassium: 4.1 mmol/L (ref 3.5–5.1)
Sodium: 138 mmol/L (ref 135–145)
Total Bilirubin: UNDETERMINED mg/dL (ref 0.3–1.2)
Total Protein: 6.4 g/dL — ABNORMAL LOW (ref 6.5–8.1)

## 2021-02-10 LAB — CBC WITH DIFFERENTIAL/PLATELET
Abs Immature Granulocytes: 0.06 10*3/uL (ref 0.00–0.07)
Basophils Absolute: 0.1 10*3/uL (ref 0.0–0.1)
Basophils Relative: 0 %
Eosinophils Absolute: 0 10*3/uL (ref 0.0–0.5)
Eosinophils Relative: 0 %
HCT: 44.8 % (ref 39.0–52.0)
Hemoglobin: 15.3 g/dL (ref 13.0–17.0)
Immature Granulocytes: 0 %
Lymphocytes Relative: 9 %
Lymphs Abs: 1.6 10*3/uL (ref 0.7–4.0)
MCH: 31.9 pg (ref 26.0–34.0)
MCHC: 34.2 g/dL (ref 30.0–36.0)
MCV: 93.3 fL (ref 80.0–100.0)
Monocytes Absolute: 1.7 10*3/uL — ABNORMAL HIGH (ref 0.1–1.0)
Monocytes Relative: 10 %
Neutro Abs: 13.7 10*3/uL — ABNORMAL HIGH (ref 1.7–7.7)
Neutrophils Relative %: 81 %
Platelets: 173 10*3/uL (ref 150–400)
RBC: 4.8 MIL/uL (ref 4.22–5.81)
RDW: 12.6 % (ref 11.5–15.5)
WBC: 17.1 10*3/uL — ABNORMAL HIGH (ref 4.0–10.5)
nRBC: 0 % (ref 0.0–0.2)

## 2021-02-10 LAB — TRIGLYCERIDES: Triglycerides: 206 mg/dL — ABNORMAL HIGH (ref <150)

## 2021-02-10 LAB — CBC
HCT: 40 % (ref 39.0–52.0)
Hemoglobin: 14.4 g/dL (ref 13.0–17.0)
MCH: 32.1 pg (ref 26.0–34.0)
MCHC: 36 g/dL (ref 30.0–36.0)
MCV: 89.1 fL (ref 80.0–100.0)
Platelets: 212 10*3/uL (ref 150–400)
RBC: 4.49 MIL/uL (ref 4.22–5.81)
RDW: 12.3 % (ref 11.5–15.5)
WBC: 17.4 10*3/uL — ABNORMAL HIGH (ref 4.0–10.5)
nRBC: 0 % (ref 0.0–0.2)

## 2021-02-10 LAB — GLUCOSE, CAPILLARY
Glucose-Capillary: 106 mg/dL — ABNORMAL HIGH (ref 70–99)
Glucose-Capillary: 114 mg/dL — ABNORMAL HIGH (ref 70–99)
Glucose-Capillary: 122 mg/dL — ABNORMAL HIGH (ref 70–99)
Glucose-Capillary: 128 mg/dL — ABNORMAL HIGH (ref 70–99)
Glucose-Capillary: 130 mg/dL — ABNORMAL HIGH (ref 70–99)
Glucose-Capillary: 131 mg/dL — ABNORMAL HIGH (ref 70–99)

## 2021-02-10 LAB — MAGNESIUM: Magnesium: 2.2 mg/dL (ref 1.7–2.4)

## 2021-02-10 IMAGING — MR MR HEAD W/O CM
5 series · 48 of 48 positions shown · non-contrast
Comparison: MRI head and CT angio head and neck [DATE]

CLINICAL DATA: Stroke follow-up

EXAM:
MRI HEAD WITHOUT CONTRAST
TECHNIQUE: Multiplanar, multiecho pulse sequences of the brain and surrounding
structures were obtained without intravenous contrast.

[Series 3: DWI · axial · 3.0mm · 1.09mm/px · z∈[-93,+51]mm · 17 of 102 slices shown (1 of 4)]
[im 1/102]
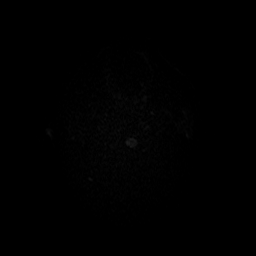
[im 7/102]
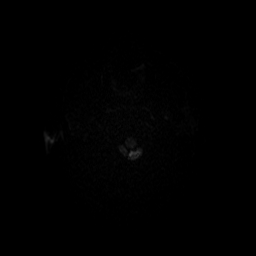
[im 13/102]
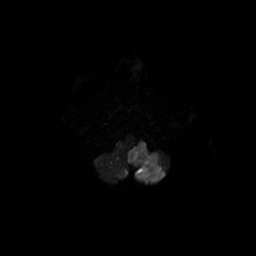
[im 19/102]
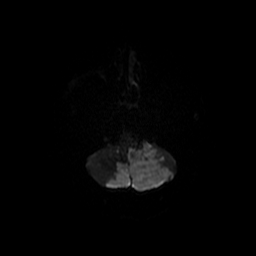
[im 26/102]
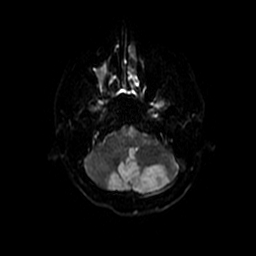
[im 32/102]
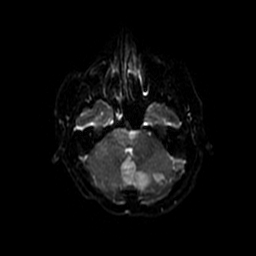
[im 38/102]
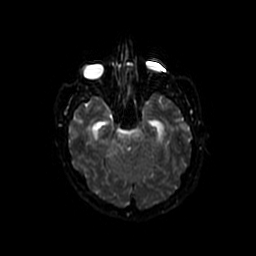
[im 45/102]
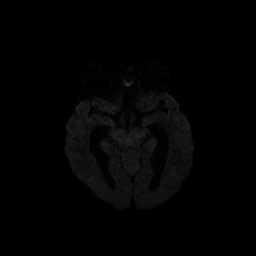
[im 51/102]
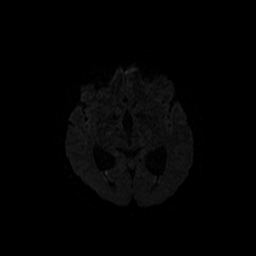
[im 57/102]
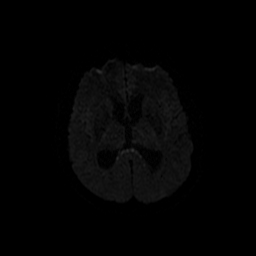
[im 64/102]
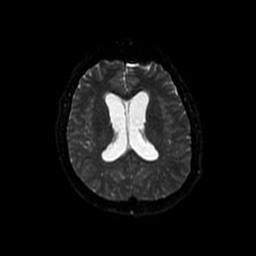
[im 70/102]
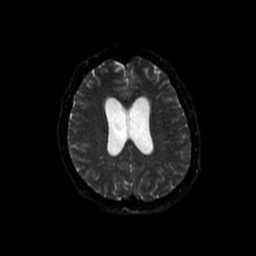
[im 76/102]
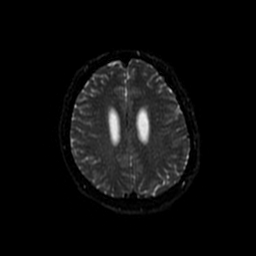
[im 83/102]
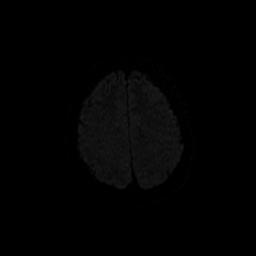
[im 89/102]
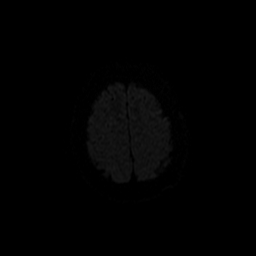
[im 95/102]
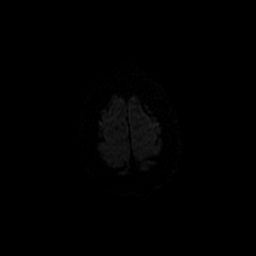
[im 102/102]
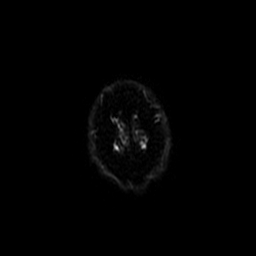

[Series 4: DWI · coronal · 5.0mm · 1.09mm/px · 12 of 70 slices shown (2 of 4)]
[im 1/70]
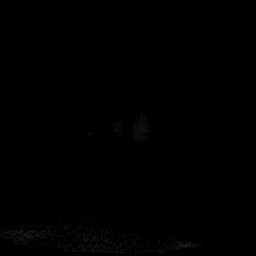
[im 7/70]
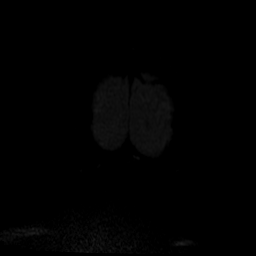
[im 13/70]
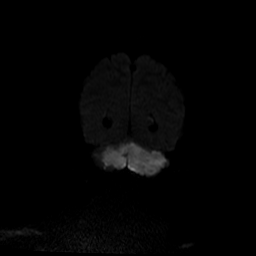
[im 19/70]
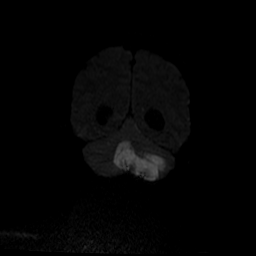
[im 26/70]
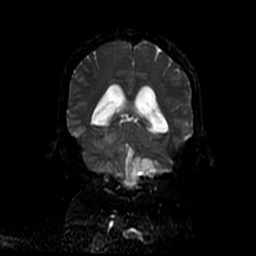
[im 32/70]
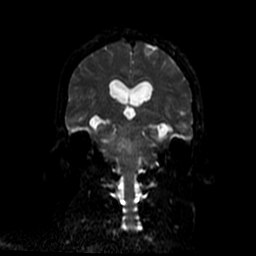
[im 38/70]
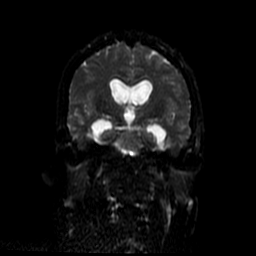
[im 44/70]
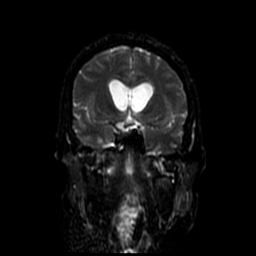
[im 51/70]
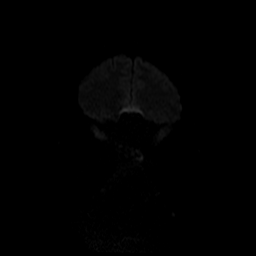
[im 57/70]
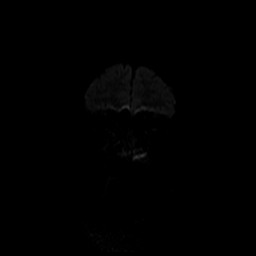
[im 63/70]
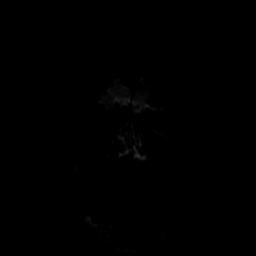
[im 70/70]
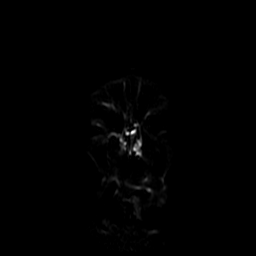

[Series 5: ax mpgr · axial · 5.0mm · 0.43mm/px · z∈[-93,+49]mm · 4 of 22 slices shown]
[im 1/22]
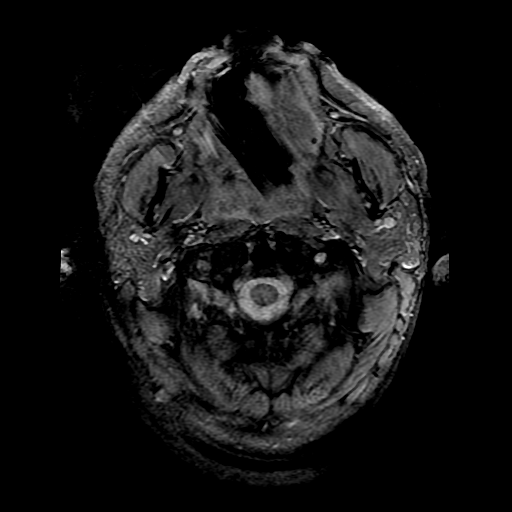
[im 8/22]
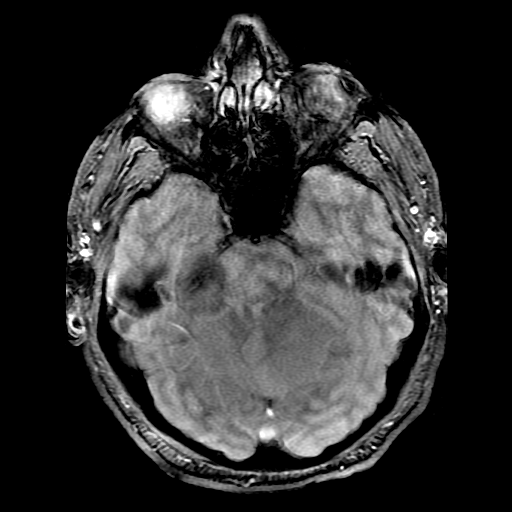
[im 15/22]
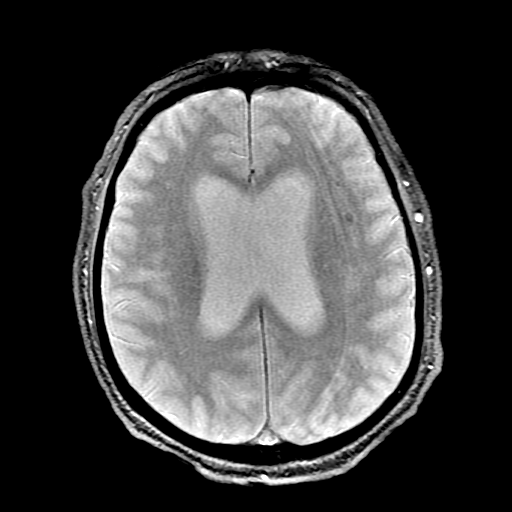
[im 22/22]
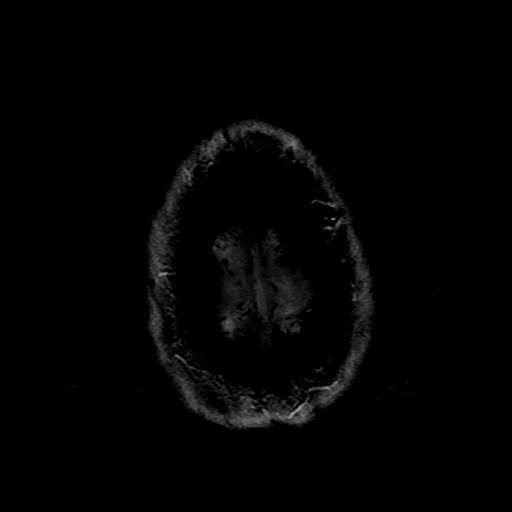

[Series 300: DWI · axial · 3.0mm · 1.09mm/px · z∈[-93,+51]mm · 9 of 51 slices shown (3 of 4)]
[im 1/51]
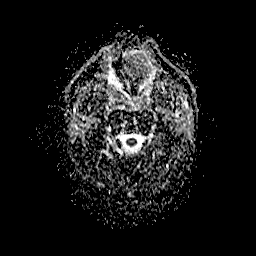
[im 7/51]
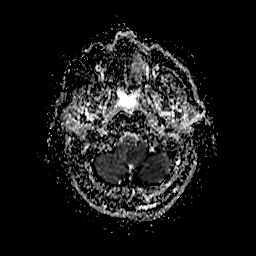
[im 13/51]
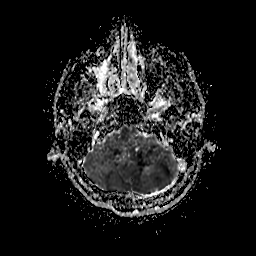
[im 19/51]
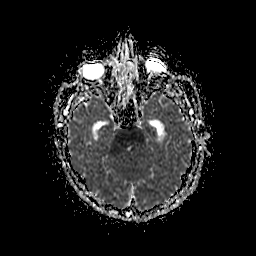
[im 26/51]
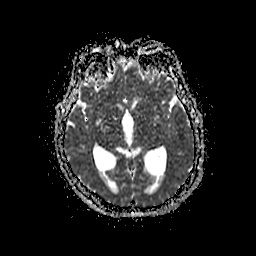
[im 32/51]
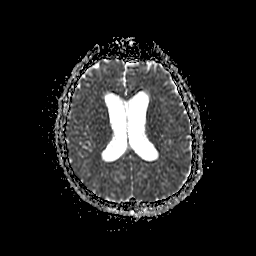
[im 38/51]
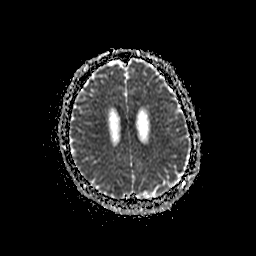
[im 44/51]
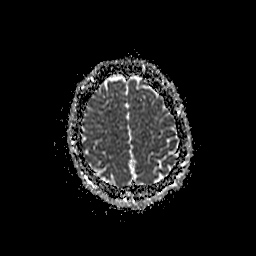
[im 51/51]
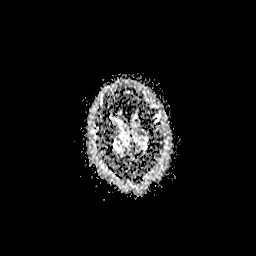

[Series 400: DWI · coronal · 5.0mm · 1.09mm/px · 6 of 35 slices shown (4 of 4)]
[im 1/35]
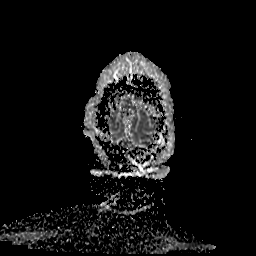
[im 7/35]
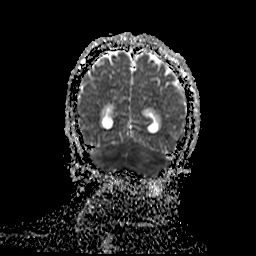
[im 14/35]
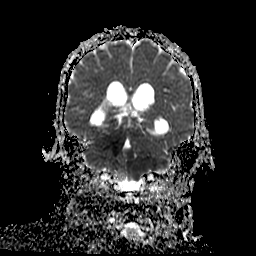
[im 21/35]
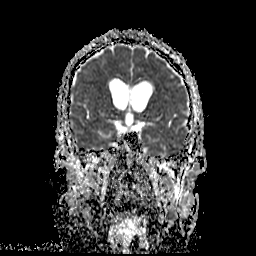
[im 28/35]
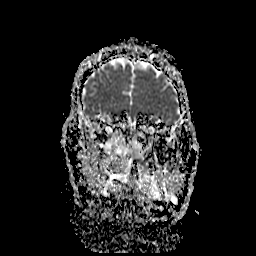
[im 35/35]
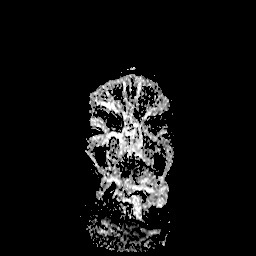

[48 of 48 positions shown; findings below may reference images not displayed]

FINDINGS: Brain: Interval progression of acute infarction in the posterior
fossa. Progressive infarct in the cerebellum bilaterally. Extensive
infarct in the pons also with progression. New area of infarction in
the splenium of the corpus callosum and in the occipital
periventricular white matter bilaterally.

Mild hydrocephalus has progressed due to edema in the posterior
fossa. No associated hemorrhage.

Limited study. Diffusion-weighted imaging and gradient echo imaging
only were performed.
IMPRESSION: Extensive infarction in the cerebellum bilaterally has progressed.
Extensive infarction throughout the pons has progressed. New areas
of acute infarct in the occipital periventricular white matter and
splenium of the corpus callosum.

Mild hydrocephalus has developed since the prior study 2 days ago.

## 2021-02-10 IMAGING — DX DG CHEST 1V PORT
1 series · 1 of 1 positions shown · non-contrast
Comparison: [DATE].

CLINICAL DATA: A 50-year-old male presents with history of
vomiting.

EXAM:
PORTABLE CHEST 1 VIEW

[chest]
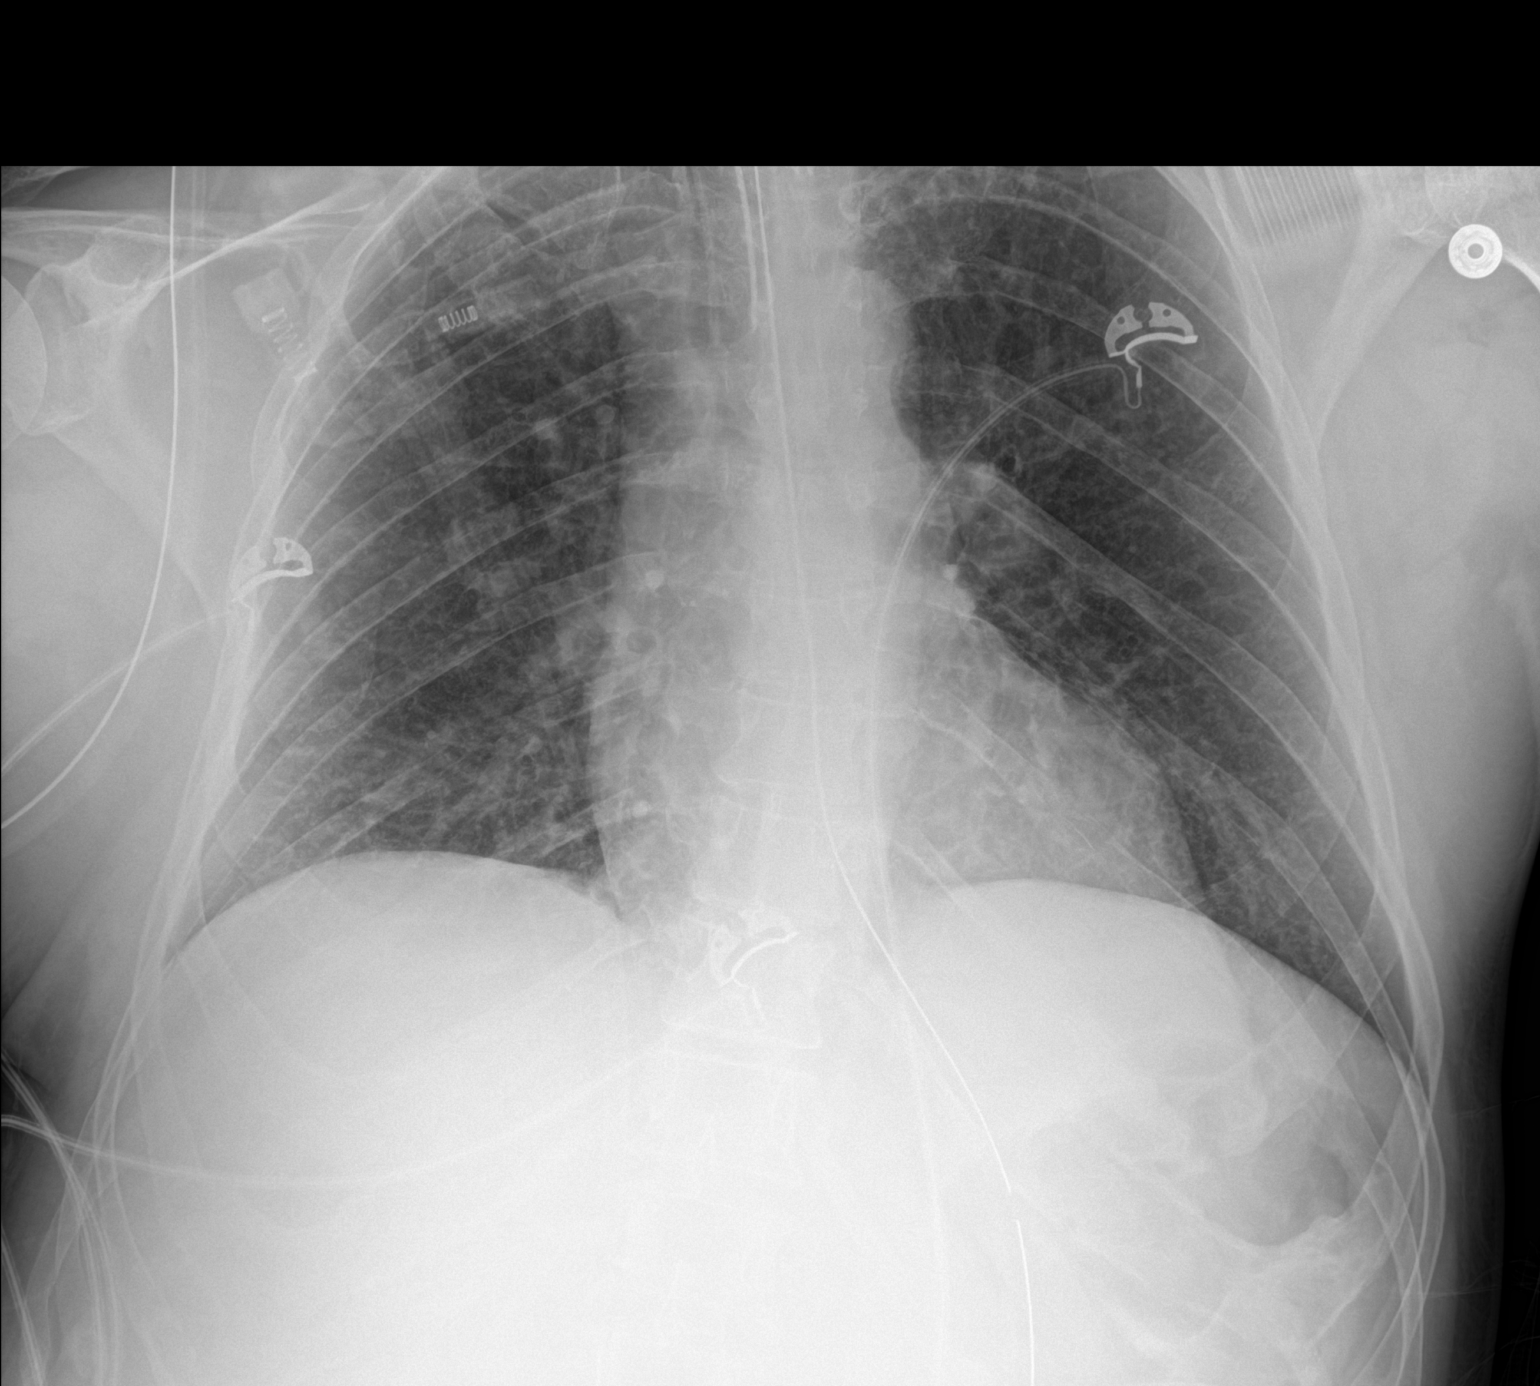

[1 of 1 positions shown; findings below may reference images not displayed]

FINDINGS: Endotracheal tube terminates 3.5 cm from the carina just below the
clavicular heads, similar position to prior imaging. A gastric tube
courses through in off the field of the radiograph.

Cardiomediastinal contours and hilar structures are stable
accounting for mild rotation on today's study.

No lobar consolidation.  No pneumothorax.

On limited assessment there is no acute skeletal process.
IMPRESSION: No acute cardiopulmonary process.

ETT and gastric tube in place, interval placement of gastric tube.

## 2021-02-10 MED ORDER — PANTOPRAZOLE SODIUM 40 MG IV SOLR
40.0000 mg | Freq: Two times a day (BID) | INTRAVENOUS | Status: DC
  Administered 2021-02-10: 40 mg via INTRAVENOUS

## 2021-02-10 MED ORDER — NOREPINEPHRINE 4 MG/250ML-% IV SOLN
INTRAVENOUS | Status: AC
  Administered 2021-02-10: 15 mg
  Filled 2021-02-10: qty 250

## 2021-02-10 MED ORDER — FENTANYL 2500MCG IN NS 250ML (10MCG/ML) PREMIX INFUSION
0.0000 ug/h | INTRAVENOUS | Status: DC
  Administered 2021-02-10: 25 ug/h via INTRAVENOUS
  Filled 2021-02-10: qty 250

## 2021-02-10 MED ORDER — ENOXAPARIN SODIUM 40 MG/0.4ML IJ SOSY
40.0000 mg | PREFILLED_SYRINGE | INTRAMUSCULAR | Status: DC
Start: 2021-02-11 — End: 2021-02-10

## 2021-02-10 NOTE — Progress Notes (Addendum)
eLink Physician-Brief Progress Note Patient Name: Richard Oneal DOB: 10/01/1970 MRN: [id number]   Date of Service  02/10/2021  HPI/Events of Note  Patient dropped his blood pressure.  eICU Interventions  Precedex and Fentanyl gtt held, Normal saline 1500 ml iv fluid bolus, Norepinephrine gtt until blood pressure recovers then will try to wean off given consideration for possible organ donation.        Thomasene Lot Deeya Richeson 02/10/2021, 10:48 PM

## 2021-02-10 NOTE — Progress Notes (Signed)
PT Cancellation Note  Patient Details Name: Richard Oneal MRN: [id number] DOB: 07-06-1970   Cancelled Treatment:    Reason Eval/Treat Not Completed: Patient not medically ready (pt remains on vent, non responsive and inappropriate for mobility. Discussed with RN and will sign off and await new order)   Amarah Brossman B Claudio Mondry 02/10/2021, 8:28 AM Merryl Hacker, PT Acute Rehabilitation Services Pager: [phone] Office: [phone]

## 2021-02-10 NOTE — Progress Notes (Signed)
Patient going to MRI at this time - will attempt EEG as schedule permits.

## 2021-02-10 NOTE — Progress Notes (Addendum)
Clinical update:  Patient had progression of his strokes - MRI today shows extensive infarctions in cerebellum and pons, along with hydrocephalus. Neurosurgery was consulted for possible EVD vs suboccipital craniectomy, however, it was determined that the degree of the patient's brain injury is not survivable.  Had extensive discussion with the patient's family (sister and aunt) regarding prognosis. They are waiting for other family to come to the hospital to see the patient, before they would like to make any definitive decisions regarding code status or comfort care measures. Chaplain was called to pray with the family in the interim.     Richard Rafter, DO Internal Medicine Resident, PGY-1 02/10/2021 3:58 PM    Addendum 4:50 PM  Family requesting that the patient's code status change to DNR. Will pursue comfort care upon arrival of the rest of the patient's family, but until then, will continue current scope of care. Patient did have a change in clinical status, with his pupils now becoming unequal and nonreactive to light.

## 2021-02-10 NOTE — Progress Notes (Signed)
RT NOTE: RT transported patient from room 2M14 to MRI and back to room 2M14 with no complications. Patient placed on full support and 100% for transport and MRI. Patient now placed back on wean and tolerating well. Vitals are stable. RT will continue to monitor.

## 2021-02-10 NOTE — Progress Notes (Signed)
RT NOTE:  [place] request SBT to be done. Pt did not trigger vent during 3 attempts. Vent returned to full support. RN aware.

## 2021-02-10 NOTE — Progress Notes (Signed)
OT Cancellation Note  Patient Details Name: Richard Oneal MRN: [id number] DOB: [birth year]   Cancelled Treatment:    Reason Eval/Treat Not Completed: Patient not medically ready. Pt remains on vent, non responsive. Will signoff for now and await new OT order.  Raynald Kemp, OT Acute Rehabilitation Services Office: [phone]  02/10/2021, 10:03 AM

## 2021-02-10 NOTE — Consult Note (Addendum)
Reason for Consult: Hydrocephalus Referring Physician: Dr. Roda Shutters  HPI: Richard Oneal is a 50 y.o. male who initally presented to [facility] ED due to c/o headache.  His speech was noted to be dysarthric and thus a code stroke was activated. 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 He was then transferred to [facility] for revascularization. On arrival to [facility], he was taken to IR, however were not able to gain access to the true lumen of the L vertebral artery and had developed collaterals in the bilateral PCA's suggesting possible chronic occlusion, therefore the procedure was aborted.  Pt remained intubated and admitted to the MICU. His neurological exam worsened over the last 24 hours. 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 NSX consult was requested.   Past Medical History:  Diagnosis Date   Dental caries    and periodontitis   Hypercholesterolemia     Past Surgical History:  Procedure Laterality Date   dental extractions     IR ANGIO INTRA EXTRACRAN SEL INTERNAL CAROTID BILAT MOD SED  02/03/2021   IR ANGIO VERTEBRAL SEL VERTEBRAL UNI R MOD SED  02/09/2021   IR CT HEAD LTD  01/31/2021   IR CT HEAD LTD  01/28/2021   IR PERCUTANEOUS ART THROMBECTOMY/INFUSION INTRACRANIAL INC DIAG ANGIO  02/12/2021   IR US GUIDE VASC ACCESS RIGHT  01/27/2021   MULTIPLE EXTRACTIONS WITH ALVEOLOPLASTY N/A 07/27/2017   Procedure: EXTRACTION teeth 2,3,4,5,6,12,15,19,20,21,22,27,28,29 WITH ALVEOLOPLASTY;  Surgeon: Ocie Doyne, DDS;  Location: [facility] OR;  Service: Oral Surgery;  Laterality: N/A;   RADIOLOGY WITH ANESTHESIA N/A 02/06/2021   Procedure: IR WITH ANESTHESIA;  Surgeon: Radiologist, Medication, MD;  Location: [facility] OR;  Service: Radiology;  Laterality: N/A;    Family History  Problem Relation Age of Onset   Heart attack Mother     Social History:  reports that he has been smoking cigars. He  has never used smokeless tobacco. He reports that he does not currently use alcohol. He reports that he does not currently use drugs.  Allergies: No Known Allergies  Medications: I have reviewed the patient's current medications.  Results for orders placed or performed during the hospital encounter of 02/05/2021 (from the past 48 hour(s))  Glucose, capillary     Status: Abnormal   Collection Time: 02/06/2021  7:29 PM  Result Value Ref Range   Glucose-Capillary 149 (H) 70 - 99 mg/dL    Comment: Glucose reference range applies only to samples taken after fasting for at least 8 hours.  Glucose, capillary     Status: Abnormal   Collection Time: 02/15/2021 11:27 PM  Result Value Ref Range   Glucose-Capillary 128 (H) 70 - 99 mg/dL    Comment: Glucose reference range applies only to samples taken after fasting for at least 8 hours.  Glucose, capillary     Status: Abnormal   Collection Time: 02/09/21  3:30 AM  Result Value Ref Range   Glucose-Capillary 116 (H) 70 - 99 mg/dL    Comment: Glucose reference range applies only to samples taken after fasting for at least 8 hours.  Hemoglobin A1c     Status: None   Collection Time: 02/09/21  6:29 AM  Result Value Ref Range   Hgb A1c MFr Bld 4.9 4.8 - 5.6 %    Comment: (NOTE) Pre diabetes:          5.7%-6.4%  Diabetes:              >  6.4%  Glycemic control for   <7.0% adults with diabetes    Mean Plasma Glucose 93.93 mg/dL    Comment: Performed at [facility], [address] [place], [place] [address]  Lipid panel     Status: Abnormal   Collection Time: 02/09/21  6:29 AM  Result Value Ref Range   Cholesterol 157 0 - 200 mg/dL   Triglycerides 604 (H) <150 mg/dL   HDL 40 (L) >54 mg/dL   Total CHOL/HDL Ratio 3.9 RATIO   VLDL 63 (H) 0 - 40 mg/dL   LDL Cholesterol 54 0 - 99 mg/dL    Comment:        Total Cholesterol/HDL:CHD Risk Coronary Heart Disease Risk Table                     Men   Women  1/2 Average Risk   3.4   3.3  Average Risk        5.0   4.4  2 X Average Risk   9.6   7.1  3 X Average Risk  23.4   11.0        Use the calculated Patient Ratio above and the CHD Risk Table to determine the patient's CHD Risk.        ATP III CLASSIFICATION (LDL):  <100     mg/dL   Optimal  098-119  mg/dL   Near or Above                    Optimal  130-159  mg/dL   Borderline  147-829  mg/dL   High  >562     mg/dL   Very High Performed at [facility], [address], [place], [place] [address]   Triglycerides     Status: Abnormal   Collection Time: 02/09/21  6:29 AM  Result Value Ref Range   Triglycerides 321 (H) <150 mg/dL    Comment: Performed at [facility], [address], [place], [place] [address]  CBC with Differential/Platelet     Status: Abnormal   Collection Time: 02/09/21  6:29 AM  Result Value Ref Range   WBC 13.6 (H) 4.0 - 10.5 K/uL   RBC 4.75 4.22 - 5.81 MIL/uL   Hemoglobin 14.8 13.0 - 17.0 g/dL   HCT 96.2 95.2 - 84.1 %   MCV 90.1 80.0 - 100.0 fL   MCH 31.2 26.0 - 34.0 pg   MCHC 34.6 30.0 - 36.0 g/dL   RDW 32.4 40.1 - 02.7 %   Platelets 247 150 - 400 K/uL   nRBC 0.0 0.0 - 0.2 %   Neutrophils Relative % 77 %   Neutro Abs 10.5 (H) 1.7 - 7.7 K/uL   Lymphocytes Relative 13 %   Lymphs Abs 1.7 0.7 - 4.0 K/uL   Monocytes Relative 10 %   Monocytes Absolute 1.3 (H) 0.1 - 1.0 K/uL   Eosinophils Relative 0 %   Eosinophils Absolute 0.0 0.0 - 0.5 K/uL   Basophils Relative 0 %   Basophils Absolute 0.0 0.0 - 0.1 K/uL   Immature Granulocytes 0 %   Abs Immature Granulocytes 0.03 0.00 - 0.07 K/uL    Comment: Performed at [facility], [address], [place], [place] [address]  Comprehensive metabolic panel     Status: Abnormal   Collection Time: 02/09/21  6:29 AM  Result Value Ref Range   Sodium 134 (L) 135 - 145 mmol/L  Potassium 3.3 (L) 3.5 - 5.1 mmol/L   Chloride 102 98 - 111 mmol/L   CO2 22 22 - 32 mmol/L   Glucose, Bld 116 (H) 70 - 99 mg/dL    Comment: Glucose reference range  applies only to samples taken after fasting for at least 8 hours.   BUN 17 6 - 20 mg/dL   Creatinine, Ser 1.61 0.61 - 1.24 mg/dL   Calcium 9.1 8.9 - 09.6 mg/dL   Total Protein 6.7 6.5 - 8.1 g/dL   Albumin 4.2 3.5 - 5.0 g/dL   AST 14 (L) 15 - 41 U/L   ALT 14 0 - 44 U/L   Alkaline Phosphatase 48 38 - 126 U/L   Total Bilirubin 0.7 0.3 - 1.2 mg/dL   GFR, Estimated >04 >54 mL/min    Comment: (NOTE) Calculated using the CKD-EPI Creatinine Equation (2021)    Anion gap 10 5 - 15    Comment: Performed at [facility], [address], [place], [place] [address]  Magnesium     Status: None   Collection Time: 02/09/21  6:29 AM  Result Value Ref Range   Magnesium 2.0 1.7 - 2.4 mg/dL    Comment: Performed at [facility], [address] [place], [place] [address]  Glucose, capillary     Status: Abnormal   Collection Time: 02/09/21  7:43 AM  Result Value Ref Range   Glucose-Capillary 133 (H) 70 - 99 mg/dL    Comment: Glucose reference range applies only to samples taken after fasting for at least 8 hours.  Glucose, capillary     Status: Abnormal   Collection Time: 02/09/21 11:53 AM  Result Value Ref Range   Glucose-Capillary 143 (H) 70 - 99 mg/dL    Comment: Glucose reference range applies only to samples taken after fasting for at least 8 hours.  Glucose, capillary     Status: Abnormal   Collection Time: 02/09/21  4:16 PM  Result Value Ref Range   Glucose-Capillary 146 (H) 70 - 99 mg/dL    Comment: Glucose reference range applies only to samples taken after fasting for at least 8 hours.  Glucose, capillary     Status: Abnormal   Collection Time: 02/09/21  7:51 PM  Result Value Ref Range   Glucose-Capillary 138 (H) 70 - 99 mg/dL    Comment: Glucose reference range applies only to samples taken after fasting for at least 8 hours.  Glucose, capillary     Status: Abnormal   Collection Time: 02/09/21 11:46 PM  Result Value Ref Range   Glucose-Capillary 144 (H) 70 - 99 mg/dL     Comment: Glucose reference range applies only to samples taken after fasting for at least 8 hours.  CBC with Differential/Platelet     Status: Abnormal   Collection Time: 02/10/21  2:48 AM  Result Value Ref Range   WBC 17.1 (H) 4.0 - 10.5 K/uL   RBC 4.80 4.22 - 5.81 MIL/uL   Hemoglobin 15.3 13.0 - 17.0 g/dL   HCT 29.5 62.1 - 30.8 %   MCV 93.3 80.0 - 100.0 fL   MCH 31.9 26.0 - 34.0 pg   MCHC 34.2 30.0 - 36.0 g/dL   RDW 65.7 84.6 - 96.2 %   Platelets 173 150 - 400 K/uL    Comment: REPEATED TO VERIFY PLATELET COUNT CONFIRMED BY SMEAR    nRBC 0.0 0.0 - 0.2 %   Neutrophils Relative % 81 %   Neutro Abs 13.7 (H)  1.7 - 7.7 K/uL   Lymphocytes Relative 9 %   Lymphs Abs 1.6 0.7 - 4.0 K/uL   Monocytes Relative 10 %   Monocytes Absolute 1.7 (H) 0.1 - 1.0 K/uL   Eosinophils Relative 0 %   Eosinophils Absolute 0.0 0.0 - 0.5 K/uL   Basophils Relative 0 %   Basophils Absolute 0.1 0.0 - 0.1 K/uL   Immature Granulocytes 0 %   Abs Immature Granulocytes 0.06 0.00 - 0.07 K/uL    Comment: Performed at [facility], [address], [place], [place] [address]  Comprehensive metabolic panel     Status: Abnormal   Collection Time: 02/10/21  2:48 AM  Result Value Ref Range   Sodium 138 135 - 145 mmol/L   Potassium 4.1 3.5 - 5.1 mmol/L   Chloride 107 98 - 111 mmol/L   CO2 19 (L) 22 - 32 mmol/L   Glucose, Bld 131 (H) 70 - 99 mg/dL    Comment: Glucose reference range applies only to samples taken after fasting for at least 8 hours.   BUN 21 (H) 6 - 20 mg/dL   Creatinine, Ser 8.31 0.61 - 1.24 mg/dL   Calcium 8.9 8.9 - 51.7 mg/dL   Total Protein 6.4 (L) 6.5 - 8.1 g/dL   Albumin 3.8 3.5 - 5.0 g/dL   AST 29 15 - 41 U/L   ALT QUANTITY NOT SUFFICIENT, UNABLE TO PERFORM TEST 0 - 44 U/L   Alkaline Phosphatase 47 38 - 126 U/L   Total Bilirubin QUANTITY NOT SUFFICIENT, UNABLE TO PERFORM TEST 0.3 - 1.2 mg/dL   GFR, Estimated >61 >60 mL/min    Comment: (NOTE) Calculated using the CKD-EPI Creatinine  Equation (2021)    Anion gap 12 5 - 15    Comment: Performed at [facility], [address] [place], [place] [address]  Glucose, capillary     Status: Abnormal   Collection Time: 02/10/21  3:12 AM  Result Value Ref Range   Glucose-Capillary 128 (H) 70 - 99 mg/dL    Comment: Glucose reference range applies only to samples taken after fasting for at least 8 hours.  Magnesium     Status: None   Collection Time: 02/10/21  6:44 AM  Result Value Ref Range   Magnesium 2.2 1.7 - 2.4 mg/dL    Comment: Performed at [facility], [address] [place], [place] [address]  Triglycerides     Status: Abnormal   Collection Time: 02/10/21  6:44 AM  Result Value Ref Range   Triglycerides 206 (H) <150 mg/dL    Comment: Performed at [facility], [address] [place], [place] [address]  Glucose, capillary     Status: Abnormal   Collection Time: 02/10/21  7:29 AM  Result Value Ref Range   Glucose-Capillary 130 (H) 70 - 99 mg/dL    Comment: Glucose reference range applies only to samples taken after fasting for at least 8 hours.  Glucose, capillary     Status: Abnormal   Collection Time: 02/10/21 11:40 AM  Result Value Ref Range   Glucose-Capillary 131 (H) 70 - 99 mg/dL    Comment: Glucose reference range applies only to samples taken after fasting for at least 8 hours.  CBC     Status: Abnormal   Collection Time: 02/10/21  1:14 PM  Result Value Ref Range   WBC 17.4 (H) 4.0 - 10.5 K/uL   RBC 4.49 4.22 - 5.81 MIL/uL   Hemoglobin  14.4 13.0 - 17.0 g/dL   HCT 16.1 09.6 - 04.5 %   MCV 89.1 80.0 - 100.0 fL   MCH 32.1 26.0 - 34.0 pg   MCHC 36.0 30.0 - 36.0 g/dL   RDW 40.9 81.1 - 91.4 %   Platelets 212 150 - 400 K/uL   nRBC 0.0 0.0 - 0.2 %    Comment: Performed at [facility], [address] [place], [place] [address]  Glucose, capillary     Status: Abnormal   Collection Time: 02/10/21  4:05 PM  Result Value Ref Range   Glucose-Capillary 106 (H) 70 - 99 mg/dL     Comment: Glucose reference range applies only to samples taken after fasting for at least 8 hours.    MR BRAIN WO CONTRAST  Result Date: 02/10/2021 CLINICAL DATA:  Stroke follow-up EXAM: MRI HEAD WITHOUT CONTRAST TECHNIQUE: Multiplanar, multiecho pulse sequences of the brain and surrounding structures were obtained without intravenous contrast. COMPARISON:  MRI head and CT angio head and neck 01/30/2021 FINDINGS: Brain: Interval progression of acute infarction in the posterior fossa. Progressive infarct in the cerebellum bilaterally. Extensive infarct in the pons also with progression. New area of infarction in the splenium of the corpus callosum and in the occipital periventricular white matter bilaterally. Mild hydrocephalus has progressed due to edema in the posterior fossa. No associated hemorrhage. Limited study. Diffusion-weighted imaging and gradient echo imaging only were performed. IMPRESSION: Extensive infarction in the cerebellum bilaterally has progressed. Extensive infarction throughout the pons has progressed. New areas of acute infarct in the occipital periventricular white matter and splenium of the corpus callosum. Mild hydrocephalus has developed since the prior study 2 days ago. Electronically Signed   By: Marlan Palau M.D.   On: 02/10/2021 13:12   DG CHEST PORT 1 VIEW  Result Date: 02/10/2021 CLINICAL DATA:  A 50 year old male presents with history of vomiting. EXAM: PORTABLE CHEST 1 VIEW COMPARISON:  February 08, 2021. FINDINGS: Endotracheal tube terminates 3.5 cm from the carina just below the clavicular heads, similar position to prior imaging. A gastric tube courses through in off the field of the radiograph. Cardiomediastinal contours and hilar structures are stable accounting for mild rotation on today's study. No lobar consolidation.  No pneumothorax. On limited assessment there is no acute skeletal process. IMPRESSION: No acute cardiopulmonary process. ETT and gastric tube in  place, interval placement of gastric tube. Electronically Signed   By: Donzetta Kohut M.D.   On: 02/10/2021 08:52   DG Abd Portable 1V  Result Date: 02/09/2021 CLINICAL DATA:  Vomiting.  On ventilator. EXAM: PORTABLE ABDOMEN - 1 VIEW COMPARISON:  Chest and abdominal radiographs 02/04/2021. FINDINGS: 1416 hours. Enteric tube tip projects to the L1 level, consistent with position in the mid stomach. The visualized bowel gas pattern is normal. The bladder is moderately distended with contrast, attributed to recent CT angiography. No supine evidence of free intraperitoneal air. The bones appear unremarkable. IMPRESSION: No evidence of acute abdominal process. Enteric tube projects to the level of the mid stomach. Electronically Signed   By: Carey Bullocks M.D.   On: 02/09/2021 14:26   DG Abd Portable 1V  Result Date: 01/31/2021 CLINICAL DATA:  Enteric catheter placement EXAM: PORTABLE ABDOMEN - 1 VIEW COMPARISON:  None. FINDINGS: Frontal view of the lower chest and upper abdomen demonstrates enteric catheter tip and side port projecting over the gastric fundus. Lung bases are clear. Bowel gas pattern is unremarkable. IMPRESSION: 1. Enteric catheter tip projecting over the  gastric fundus. Electronically Signed   By: Sharlet Salina M.D.   On: 02/10/2021 19:52   ECHOCARDIOGRAM COMPLETE BUBBLE STUDY  Result Date: 02/09/2021    ECHOCARDIOGRAM REPORT   Patient Name:   Richard Oneal Date of Exam: 02/09/2021 Medical Rec #:  [id number]    Height:       66.0 in Accession #:    [id number]   Weight:       176.4 lb Date of Birth:  [birth year]   BSA:          1.896 m Patient Age:    50 years     BP:           154/80 mmHg Patient Gender: M            HR:           98 bpm. Exam Location:  Inpatient Procedure: 2D Echo, Cardiac Doppler, Color Doppler and Saline Contrast Bubble            Study Indications:    Stroke 434.91 / I63.9  History:        Patient has prior history of Echocardiogram examinations, most                  recent 07/16/2020. Risk Factors:Dyslipidemia.  Sonographer:    Leta Jungling RDCS Referring Phys: [id number] ASHISH ARORA IMPRESSIONS  1. Left ventricular ejection fraction, by estimation, is 65 to 70%. The left ventricle has normal function. The left ventricle has no regional wall motion abnormalities. Left ventricular diastolic parameters were normal.  2. Right ventricular systolic function is normal. The right ventricular size is normal. Tricuspid regurgitation signal is inadequate for assessing PA pressure.  3. The mitral valve is normal in structure. No evidence of mitral valve regurgitation.  4. The aortic valve is tricuspid. Aortic valve regurgitation is not visualized.  5. Cannot exclude a small PFO. Agitated saline contrast bubble study was minimally positive with very small shunting observed within 3-6 cardiac cycles suggestive of interatrial shunt. Comparison(s): No prior Echocardiogram. FINDINGS  Left Ventricle: Left ventricular ejection fraction, by estimation, is 65 to 70%. The left ventricle has normal function. The left ventricle has no regional wall motion abnormalities. The left ventricular internal cavity size was normal in size. There is  no left ventricular hypertrophy. Left ventricular diastolic parameters were normal. The ratio of pulmonic flow to systemic flow (Qp/Qs ratio) is 1.30. Right Ventricle: The right ventricular size is normal. No increase in right ventricular wall thickness. Right ventricular systolic function is normal. Tricuspid regurgitation signal is inadequate for assessing PA pressure. Left Atrium: Left atrial size was normal in size. Right Atrium: Right atrial size was normal in size. Pericardium: There is no evidence of pericardial effusion. Mitral Valve: The mitral valve is normal in structure. No evidence of mitral valve regurgitation. Tricuspid Valve: The tricuspid valve is normal in structure. Tricuspid valve regurgitation is not demonstrated. No evidence of tricuspid  stenosis. Aortic Valve: The aortic valve is tricuspid. Aortic valve regurgitation is not visualized. Pulmonic Valve: The pulmonic valve was grossly normal. Pulmonic valve regurgitation is not visualized. No evidence of pulmonic stenosis. Aorta: The aortic root and ascending aorta are structurally normal, with no evidence of dilitation. IAS/Shunts: The interatrial septum is aneurysmal. Cannot exclude a small PFO. Agitated saline contrast was given intravenously to evaluate for intracardiac shunting. Agitated saline contrast bubble study was positive with shunting observed within 3-6 cardiac cycles suggestive of interatrial shunt. The ratio of pulmonic flow to systemic  flow (Qp/Qs ratio) is 1.30.  LEFT VENTRICLE PLAX 2D LVIDd:         4.60 cm   Diastology LVIDs:         2.60 cm   LV e' medial:    8.66 cm/s LV PW:         0.80 cm   LV E/e' medial:  10.3 LV IVS:        1.00 cm   LV e' lateral:   10.58 cm/s LVOT diam:     2.00 cm   LV E/e' lateral: 8.4 LV SV:         64 LV SV Index:   34 LVOT Area:     3.14 cm  RIGHT VENTRICLE RV S prime:     22.10 cm/s RVOT diam:      2.20 cm TAPSE (M-mode): 2.3 cm LEFT ATRIUM             Index       RIGHT ATRIUM           Index LA diam:        2.90 cm 1.53 cm/m  RA Area:     10.50 cm LA Vol (A2C):   16.7 ml 8.81 ml/m  RA Volume:   22.60 ml  11.92 ml/m LA Vol (A4C):   17.6 ml 9.28 ml/m LA Biplane Vol: 17.3 ml 9.13 ml/m  AORTIC VALVE             PULMONIC VALVE LVOT Vmax:   135.00 cm/s PV Area (Vmax):  2.50 cm LVOT Vmean:  96.700 cm/s PV Area (Vmean): 2.54 cm LVOT VTI:    0.203 m     PV Area (VTI):   2.52 cm                          PV Vmax:         2.07 m/s AORTA                    PV Vmean:        151.000 cm/s Ao Root diam: 3.20 cm    PV VTI:          0.337 m Ao Asc diam:  3.40 cm    PV Peak grad:    17.1 mmHg                          PV Mean grad:    10.0 mmHg                          RVOT Peak grad:  7 mmHg  MITRAL VALVE MV Area (PHT): 3.50 cm    SHUNTS MV Decel Time: 217  msec    Systemic VTI:  0.20 m MV E velocity: 88.90 cm/s  Systemic Diam: 2.00 cm MV A velocity: 88.05 cm/s  Pulmonic VTI:  0.223 m MV E/A ratio:  1.01        Pulmonic Diam: 2.20 cm                            Qp/Qs:         1.33 Riley Lam MD Electronically signed by Riley Lam MD Signature Date/Time: 02/09/2021/3:00:51 PM    Final     ROS: Unable to complete due to acuity of patient. Unresponsive and on vent  Blood pressure 133/78, pulse (!) 118, temperature (!) 100.8 F (38.2 C), temperature source Axillary, resp. rate 19, height 5\' 6"  (1.676 m), weight 80 kg, SpO2 99 %. Physical exam:   Patient is intubated and unresponsive. On fentanyl and Precedex gtt for vent synchrony. Unable to follow commands. Slight withdrawal to noxious stimuli in the LLE. Myoclonic jerking in his RLE and BUE. No cough, gag or corneal reflex. No eye opening. Pupils 2-3 mm and fixed. Breathing over the vent. Chest retraction on inspiration. Tachycardic.   Assessment/Plan:   50 y.o. male with bilateral VA and proximal BA occlusion.  IR attempted revascularization, but was unsuccessful. Neuro exam progressively worsened and subsequently underwent a repeat MRI brain. Repeat MRI revealed extensive progression of infarcts within the pons and bilateral cerebellum as well as new areas of acute infarct in the occipital periventricular white matter and splenium of the corpus callosum. There is mild hydrocephalus. Unfortunately, there is no neurosurgical intervention to offer this patient. He would not benefit from an EVD or suboccipital crani nor would it change the patient's outcome if performed. I recommend continuing supportive care and palliative consult. I discussed the patient's exam, MRI results and prognosis with the patient's sister and aunt who were at bedside. This is a devastating brain injury with overall poor prognosis.    44, DNP, NP-C 02/10/2021, 4:43 PM   Addendum:  Patient seen.   Agree with above.  MRI today shows progressive cerebellar, pontine and splenium and occipital infarcts with mild hydrocephalus.  Given significant pontine stroke, I do not recommend any neurosurgical intervention.  Patient has a poor neurologic exam therefore recommend palliative evaluation.  I answered all the questions of the family members.  Please call with any questions or concerns.   Thank you for allowing me to participate in this patient's care.  Please do not hesitate to call with questions or concerns.   02/12/2021, DO Neurosurgeon [facility] Cell: [phone]

## 2021-02-10 NOTE — Progress Notes (Signed)
NAME:  Richard Oneal, MRN:  [id number], DOB:  02/28/1970, LOS: 2 ADMISSION DATE:  Feb 15, 2021, CONSULTATION DATE:  12/14 REFERRING MD:  Wilford Corner, CHIEF COMPLAINT:  Stroke, vent management   History of Present Illness:  Richard Oneal is a 50 y.o. M with PMH of HL who presented to [facility] ED for headache.  He was noted to have slurred speech and code stroke was activated.  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  LKW was the night before presentation, so was outside the window for TPA.     On arrival to [facility] was taken to IR, however were not able to gain access to the true lumen of the L vertebral artery and developed collaterals in the bilateral PCA's suggested possible chronic occlusion, therefore the procedure was aborted.  Pt left intubated and MRI ordered. PCCM consulted for ventilator management.   Pertinent  Medical History   has a past medical history of Dental caries and Hypercholesterolemia.  Significant Hospital Events: Including procedures, antibiotic start and stop dates in addition to other pertinent events   12/13 Transferred from [place] to [facility], thrombectomy unsuccessful, left intubated pending MRI and PCCM consult for vent management, on Cleviprex 12/14 Attempted SBT, however, patient too sedated/asleep to do so   Interim History / Subjective:  Sedated, unable to follow commands at this time. Remains on ventilator. Multiple episodes of nausea and vomiting throughout the day yesterday and overnight   Objective   Blood pressure (!) 156/75, pulse (!) 107, temperature (!) 100.4 F (38 C), temperature source Axillary, resp. rate 14, height 5\' 6"  (1.676 m), weight 80 kg, SpO2 99 %.    Vent Mode: PRVC FiO2 (%):  [40 %] 40 % Set Rate:  [16 bmp] 16 bmp Vt Set:  [550 mL] 550 mL PEEP:  [5 cmH20] 5 cmH20 Pressure Support:  [5 cmH20-8 cmH20] 5 cmH20 Plateau  Pressure:  [9 cmH20-14 cmH20] 9 cmH20   Intake/Output Summary (Last 24 hours) at 02/10/2021 0659 Last data filed at 02/10/2021 0400 Gross per 24 hour  Intake 975.84 ml  Output 1550 ml  Net -574.16 ml   Filed Weights   2021/02/15 1328  Weight: 80 kg    Examination: General: Patient remains intubated, sedated. NAD HENT: [place], AT. ETT in place Lungs: CTAB, no crackles/wheezing. On vent Cardiovascular: RRR, no murmur Abdomen: soft, nontender, +BS Extremities: No edema Neuro: Sedated, unable to follow commands. 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 Mild edema, punctate infarct R parietal area  - Neuro consulted, appreciate recs - Continue Cleviprex - SBP goal 120-160  Acute respiratory failure 2/2 above Vent settings: PRVC, RR 16, Tv 550, PEEP 5 and FiO2 40%. Peak pressure 14 and plateau pressure 9 with these settings.  - SBTs as tolerated, when patient more awake - Maintain SpO2 >90%  Nausea  Vomiting No acute abdominal process on xray.  - PRN reglan, zofran   Best Practice (right click and "Reselect all SmartList Selections" daily)   Diet/type: NPO DVT prophylaxis: SCD GI prophylaxis: PPI Lines: N/A Foley:  N/A Code Status:  full code Last date of multidisciplinary goals of care discussion [family updated 12/14]  Labs   CBC:  Recent Labs  Lab 03-Mar-2021 1345 03/03/2021 1347 02/09/21 0629 02/10/21 0248  WBC 10.8*  --  13.6* 17.1*  NEUTROABS  --   --  10.5* 13.7*  HGB 15.8 15.0 14.8 15.3  HCT 45.6 44.0 42.8 44.8  MCV 88.9  --  90.1 93.3  PLT 246  --  247 173    Basic Metabolic Panel: Recent Labs  Lab March 03, 2021 1345 03-03-21 1347 02/09/21 0629 02/10/21 0248  NA 139 139  134* 138  K 3.5 3.4* 3.3* 4.1  CL 106  --  102 107  CO2 23  --  22 19*  GLUCOSE 130*  --  116* 131*  BUN 9  --  17 21*  CREATININE 0.74  --  1.03 1.01  CALCIUM 9.1  --  9.1 8.9  MG  --   --  2.0  --    GFR: Estimated Creatinine Clearance: 87 mL/min (by C-G formula based on SCr of 1.01 mg/dL). Recent Labs  Lab 2021/03/03 1345 02/09/21 0629 02/10/21 0248  WBC 10.8* 13.6* 17.1*    Liver Function Tests: Recent Labs  Lab 03/03/2021 1345 02/09/21 0629 02/10/21 0248  AST 17 14* 29  ALT 18 14 QUANTITY NOT SUFFICIENT, UNABLE TO PERFORM TEST  ALKPHOS 55 48 47  BILITOT 0.9 0.7 QUANTITY NOT SUFFICIENT, UNABLE TO PERFORM TEST  PROT 7.5 6.7 6.4*  ALBUMIN 4.6 4.2 3.8   No results for input(s): LIPASE, AMYLASE in the last 168 hours. No results for input(s): AMMONIA in the last 168 hours.  ABG    Component Value Date/Time   PHART 7.373 03/03/2021 1347   PCO2ART 42.8 03-Mar-2021 1347   PO2ART 131 (H) 03-Mar-2021 1347   HCO3 24.9 03-03-2021 1347   TCO2 26 March 03, 2021 1347   ACIDBASEDEF 1.0 03/03/2021 1347   O2SAT 99.0 2021-03-03 1347     Coagulation Profile: No results for input(s): INR, PROTIME in the last 168 hours.  Cardiac Enzymes: No results for input(s): CKTOTAL, CKMB, CKMBINDEX, TROPONINI in the last 168 hours.  HbA1C: Hgb A1c MFr Bld  Date/Time Value Ref Range Status  02/09/2021 06:29 AM 4.9 4.8 - 5.6 % Final    Comment:    (NOTE) Pre diabetes:          5.7%-6.4%  Diabetes:              >6.4%  Glycemic control for   <7.0% adults with diabetes     CBG: Recent Labs  Lab 02/09/21 1153 02/09/21 1616 02/09/21 1951 02/09/21 2346 02/10/21 0312  GLUCAP 143* 146* 138* 144* 128*    Review of Systems:     Critical care time: 30 minutes

## 2021-02-10 NOTE — Progress Notes (Addendum)
Initial Nutrition Assessment  DOCUMENTATION CODES:  Not applicable  INTERVENTION:  RD to monitor nausea/vomiting for further nutrition intervention.  If unable to feed gastric, recommend post-pyloric tube placement.  If able to feed enterally, initiate tube feeding via OGT, starting with trickles: Osmolite 1.5 at 20 ml/h and increase by 10 ml every 6 hrs to goal rate of 60 ml/h (1440 ml per day) Prosource TF 45 ml BID  Provides 2240 kcal, 112 gm protein, 1094 ml free water daily.  Free water flushes per CCM.  Monitor magnesium and phosphorus every 12 hours x 4 occurrences, MD to replete as needed, as pt is at risk for refeeding syndrome given no PO intake x4 days.   NUTRITION DIAGNOSIS:  Inadequate oral intake related to inability to eat as evidenced by NPO status.  GOAL:  Provide needs based on ASPEN/SCCM guidelines  MONITOR:  Vent status, Labs, Weight trends, I & O's  REASON FOR ASSESSMENT:  Ventilator    ASSESSMENT:  50 yo male with a PMH of hypercholesterolemia and dental caries who presents with basilar artery occlusion. 12/13 - TRF from [place] to [facility]; intubated; OGT placed (tip mid stomach per x-ray) 12/14 - attempted SBT; emesis x2 (200 ml)  Per CCM note this morning, pt with multiple episodes of nausea and vomiting over night and during the day yesterday.  No evidence of acute abdominal process per abdominal x-ray.  Patient is currently intubated on ventilator support. MV: 13.6 L/min Temp (24hrs), Avg:100.6 F (38.1 C), Min:98.4 F (36.9 C), Max:102.9 F (39.4 C)  Cleviprex: 26 ml/hr (provides 1248 kcal/day)  No recent weight history in Epic. Per Care Everywhere, pt weighed 150 lbs in 04/2020. Pt currently weighs 176.37 lbs.  Of note, pt with no edema noted.  Spoke with RN at bedside. RN reports that pt has had vomiting and reports that they will not be feeding enterally due to this today. The question was if RD evaluated and is considering TPN.  Per  discussion with sister at bedside, pt may have lost a bit of weight, but Care Everywhere does not match that.   Also, sister unable to provide information based on intake, but she is pretty sure he has not been eating less, but she discussed that what he eats is "unhealthy." Of note, pt has not eaten since between 7-11 pm on Monday, 12/12, approaching day 4 without nutrition.  Addendum: Considering post-pyloric tube placement or starting trickle feedings. Messaged MD regarding plan for feeding.  On exam, pt with limited depletions.  Intake/Output Summary (Last 24 hours) at 02/10/2021 1516 Last data filed at 02/10/2021 0400 Gross per 24 hour  Intake 502.53 ml  Output 60 ml  Net 442.53 ml   Medications: reviewed; colace BID, Synthroid, Reglan QID, Protonix, miralax, NaCl @ 75 ml/hr, Cleviprex, Precedex  Labs: reviewed; BUN 21 (H - trending up), CBG 128-146 (H)  NUTRITION - FOCUSED PHYSICAL EXAM: Flowsheet Row Most Recent Value  Orbital Region Mild depletion  Upper Arm Region No depletion  Thoracic and Lumbar Region No depletion  [Distension]  Buccal Region Unable to assess  Temple Region Mild depletion  Clavicle Bone Region No depletion  Clavicle and Acromion Bone Region No depletion  Scapular Bone Region Unable to assess  Dorsal Hand Unable to assess  [Mitts]  Patellar Region Mild depletion  Anterior Thigh Region Mild depletion  Posterior Calf Region Moderate depletion  Edema (RD Assessment) None  Hair Reviewed  Eyes Unable to assess  Mouth Unable to assess  Skin  Reviewed  Nails Unable to assess   Diet Order:   Diet Order             Diet NPO time specified  Diet effective now                  EDUCATION NEEDS:  Education needs have been addressed  Skin:  Skin Assessment: Reviewed RN Assessment  Last BM:  PTA  Height:  Ht Readings from Last 1 Encounters:  March 10, 2021 5\' 6"  (1.676 m)   Weight:  Wt Readings from Last 1 Encounters:  2021/03/10 80 kg   BMI:   Body mass index is 28.47 kg/m.  Estimated Nutritional Needs:  Kcal:  2150 Protein:  110-125 grams Fluid:  >2.1 L  2151, RD, LDN (she/her/hers) Clinical Inpatient Dietitian RD Pager/After-Hours/Weekend Pager # in [place]

## 2021-02-10 NOTE — Progress Notes (Signed)
I responded to a page from the nurse to provide spiritual support for the patient's family. I provided support through pastoral presence, read scripture, and led in prayer.    02/10/21 2000  Clinical Encounter Type  Visited With Patient and family together  Visit Type Spiritual support  Referral From Nurse  Consult/Referral To Chaplain  Spiritual Encounters  Spiritual Needs Prayer;Emotional    Chaplain Dr Melvyn Novas

## 2021-02-10 NOTE — Progress Notes (Signed)
°   02/10/21 1535  Clinical Encounter Type  Visited With Patient and family together  Visit Type Patient actively dying  Referral From Physician  Consult/Referral To Chaplain   Chaplain Tery Sanfilippo received a call from MD de Saintclair Halsted stating family is requesting a chaplain. The attending nurse, Maralyn Sago, greeted Richard Oneal upon arrival. The patient's aunt Darel Hong and later joined by the patient's sister, [place], at his bedside. Richard Oneal provided grief support to the patient's sister, who was acutely distraught, and extended active listening and supportive presence. Richard Oneal concluded the visit with prayer. Ms. Darel Hong said other family members would arrive later and would like the Chaplain to return to pray over the family. Advise I will refer the request to the PM Chaplain. This note was prepared by Deneen Harts, M.Div..  For questions please contact by phone [phone].

## 2021-02-10 NOTE — Progress Notes (Addendum)
STROKE TEAM PROGRESS NOTE   INTERVAL HISTORY Patient is seen in room with his sister at the bedside.  Overnight, he has been hemodynamically stable with fevers up to 100.9.  He has continued to have episodes of dark emesis.  His neurological exam has worsened since yesterday afternoon.MRI today shows extensive infarctions in the cerebellum and pons along with mild hydrocephalus.  Changes and prognosis were discussed with patient's family, who wished to proceed with aggressive care.  Will consult neurosurgery for possible EVD or suboccipital craniectomy.  Vitals:   02/10/21 1230 02/10/21 1245 02/10/21 1251 02/10/21 1300  BP: (!) 176/81 (!) 164/87    Pulse: (!) 133 (!) 147  (!) 124  Resp:  17  16  Temp:      TempSrc:      SpO2: 100% 100% 100% 100%  Weight:      Height:       CBC:  Recent Labs  Lab 02/09/21 0629 02/10/21 0248  WBC 13.6* 17.1*  NEUTROABS 10.5* 13.7*  HGB 14.8 15.3  HCT 42.8 44.8  MCV 90.1 93.3  PLT 247 173    Basic Metabolic Panel:  Recent Labs  Lab 02/09/21 0629 02/10/21 0248 02/10/21 0644  NA 134* 138  --   K 3.3* 4.1  --   CL 102 107  --   CO2 22 19*  --   GLUCOSE 116* 131*  --   BUN 17 21*  --   CREATININE 1.03 1.01  --   CALCIUM 9.1 8.9  --   MG 2.0  --  2.2    Lipid Panel:  Recent Labs  Lab 02/09/21 0629 02/10/21 0644  CHOL 157  --   TRIG 315*   321* 206*  HDL 40*  --   CHOLHDL 3.9  --   VLDL 63*  --   LDLCALC 54  --     HgbA1c:  Recent Labs  Lab 02/09/21 0629  HGBA1C 4.9    Urine Drug Screen:  Recent Labs  Lab 02/21/2021 1533  LABOPIA NONE DETECTED  COCAINSCRNUR NONE DETECTED  LABBENZ NONE DETECTED  AMPHETMU NONE DETECTED  THCU POSITIVE*  LABBARB NONE DETECTED     Alcohol Level No results for input(s): ETH in the last 168 hours.  IMAGING past 24 hours MR BRAIN WO CONTRAST  Result Date: 02/10/2021 CLINICAL DATA:  Stroke follow-up EXAM: MRI HEAD WITHOUT CONTRAST TECHNIQUE: Multiplanar, multiecho pulse sequences of the  brain and surrounding structures were obtained without intravenous contrast. COMPARISON:  MRI head and CT angio head and neck 02/17/2021 FINDINGS: Brain: Interval progression of acute infarction in the posterior fossa. Progressive infarct in the cerebellum bilaterally. Extensive infarct in the pons also with progression. New area of infarction in the splenium of the corpus callosum and in the occipital periventricular white matter bilaterally. Mild hydrocephalus has progressed due to edema in the posterior fossa. No associated hemorrhage. Limited study. Diffusion-weighted imaging and gradient echo imaging only were performed. IMPRESSION: Extensive infarction in the cerebellum bilaterally has progressed. Extensive infarction throughout the pons has progressed. New areas of acute infarct in the occipital periventricular white matter and splenium of the corpus callosum. Mild hydrocephalus has developed since the prior study 2 days ago. Electronically Signed   By: Marlan Palau M.D.   On: 02/10/2021 13:12   DG CHEST PORT 1 VIEW  Result Date: 02/10/2021 CLINICAL DATA:  A 50 year old male presents with history of vomiting. EXAM: PORTABLE CHEST 1 VIEW COMPARISON:  February 08, 2021. FINDINGS: Endotracheal tube  terminates 3.5 cm from the carina just below the clavicular heads, similar position to prior imaging. A gastric tube courses through in off the field of the radiograph. Cardiomediastinal contours and hilar structures are stable accounting for mild rotation on today's study. No lobar consolidation.  No pneumothorax. On limited assessment there is no acute skeletal process. IMPRESSION: No acute cardiopulmonary process. ETT and gastric tube in place, interval placement of gastric tube. Electronically Signed   By: Donzetta Kohut M.D.   On: 02/10/2021 08:52   DG Abd Portable 1V  Result Date: 02/09/2021 CLINICAL DATA:  Vomiting.  On ventilator. EXAM: PORTABLE ABDOMEN - 1 VIEW COMPARISON:  Chest and abdominal  radiographs 02/15/2021. FINDINGS: 1416 hours. Enteric tube tip projects to the L1 level, consistent with position in the mid stomach. The visualized bowel gas pattern is normal. The bladder is moderately distended with contrast, attributed to recent CT angiography. No supine evidence of free intraperitoneal air. The bones appear unremarkable. IMPRESSION: No evidence of acute abdominal process. Enteric tube projects to the level of the mid stomach. Electronically Signed   By: Carey Bullocks M.D.   On: 02/09/2021 14:26    PHYSICAL EXAM General: Intubated, well-developed male in no acute distress.  CV: Regular rate and rhythm  Respiratory:  Respirations even and synchronous with ventilator  NEURO:  Patient is unresponsive to voice or touch. PERRL with no corneal reflexes and no doll's eyes reflex.  Cough reflex present.  No response to noxious stimuli.   ASSESSMENT/PLAN [name] is a 50 y.o. male with history of HLD and cognitive impairment presenting with gait imbalance, headache, slurred speech and left-sided numbness.  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  This procedure was unfortunately unsuccessful.  Patient remained intubated after the procedure and was admitted to the ICU.  His neurological exam has worsened since yesterday afternoon.MRI today shows extensive infarctions in the cerebellum and pons along with mild hydrocephalus.  Changes and prognosis were discussed with patient's family, who wished to proceed with aggressive care.  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  Therapy recommendations:  pending Disposition:  poor prognosis now, continue family discussion for GOC  Hydrocephalus New mild hydrocephalus on MRI today Discussed with neurosurgery for possible EVD or suboccipital craniectomy if aggressive care NSG does not feel the procedure will change prognosis Family now considering DNR  Hypertension Home meds:  none Unstable Requiring Cleviprex Will relax BP goal given extension of stroke in the setting of chronic b/l VAs and BA occlusion BG goal SBP 130-180 Long-term BP goal normotensive  Hyperlipidemia Home meds:  rosuvastatin 10 mg daily, resumed in hospital LDL  54, goal < 70 High intensity statin deferred as LDL below goal Continue statin at discharge  Respiratory failure Patient remains intubated after procedure Worsening neuro exam Unable to extubate due to poor neurological status  Recurrent vomiting Possibly r/t extension of cerebellar / brainstem stroke OG tube wall suctioning  Management per CCM  Other Stroke Risk Factors Substance abuse - UDS:  THC POSITIVE. Patient advised to stop using due to stroke risk. Family hx stroke   Other Active Problems Hx of speech disorder  Stress ulcer - coffee ground emesis   Hospital day # 2  Cortney E Ernestina Columbia , MSN, AGACNP-BC [facility] See Amion for schedule and pager information 02/10/2021 1:51 PM  ATTENDING NOTE: I reviewed  above note and agree with the assessment and plan. Pt was seen and examined.   Sister at the bedside. Pt still intubated off sedation but not responsive, not open eyes or following commands, not moving extremities on pain stimulation. which is a change from yesterday afternoon. Sister reported yesterday afternoon, pt seems able to follow limited commands open and close eyes. CCM also reported frequent N/V and needed OG wall suctioning. This am also had coffee ground emesis. These neuro changes triggered a repeat MRI brain which showed stroke extension with extensive brainstem, b/l cerebellar large infarcts and hydrocephalus. Discussed with pt aunt who then communicated with pt sister over the phone, regarding poor prognosis and devastating outcome but they still wanted EVD or suboccipital decompression for pt survival. I talked with NSG Julien Girt NP and Dr. Jake Samples, they discussed further with family that such neurosurgical procedure would not change pt poor outcome. CCM also had further discussion with family, now family requested DNR and would wait for further family visit and then decide on comfort care measures.   For detailed assessment and plan, please refer to above as I have made changes wherever appropriate.   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 This patient's care requires constant monitoring of vital signs, hemodynamics, respiratory and cardiac monitoring, review of multiple databases, neurological assessment, discussion with family, other specialists and medical decision making of high complexity. I spent 50 minutes of neurocritical care time in the care of this patient.   I also discussed with neurosurgery and CCM.      To contact Stroke Continuity provider, please refer to [email or web address]. After hours, contact General Neurology

## 2021-02-11 ENCOUNTER — Inpatient Hospital Stay (HOSPITAL_COMMUNITY): Payer: Medicaid Other

## 2021-02-11 DIAGNOSIS — Z515 Encounter for palliative care: Secondary | ICD-10-CM

## 2021-02-11 DIAGNOSIS — Z452 Encounter for adjustment and management of vascular access device: Secondary | ICD-10-CM

## 2021-02-11 DIAGNOSIS — G9382 Brain death: Secondary | ICD-10-CM

## 2021-02-11 DIAGNOSIS — Z66 Do not resuscitate: Secondary | ICD-10-CM | POA: Diagnosis not present

## 2021-02-11 DIAGNOSIS — I63233 Cerebral infarction due to unspecified occlusion or stenosis of bilateral carotid arteries: Secondary | ICD-10-CM | POA: Diagnosis not present

## 2021-02-11 LAB — CBC
HCT: 33 % — ABNORMAL LOW (ref 39.0–52.0)
HCT: 30 % — ABNORMAL LOW (ref 39.0–52.0)
Hemoglobin: 11.2 g/dL — ABNORMAL LOW (ref 13.0–17.0)
Hemoglobin: 10.4 g/dL — ABNORMAL LOW (ref 13.0–17.0)
MCH: 31.1 pg (ref 26.0–34.0)
MCH: 32 pg (ref 26.0–34.0)
MCHC: 33.9 g/dL (ref 30.0–36.0)
MCHC: 34.7 g/dL (ref 30.0–36.0)
MCV: 91.7 fL (ref 80.0–100.0)
MCV: 92.3 fL (ref 80.0–100.0)
Platelets: 156 10*3/uL (ref 150–400)
Platelets: 128 10*3/uL — ABNORMAL LOW (ref 150–400)
RBC: 3.6 MIL/uL — ABNORMAL LOW (ref 4.22–5.81)
RBC: 3.25 MIL/uL — ABNORMAL LOW (ref 4.22–5.81)
RDW: 12.7 % (ref 11.5–15.5)
RDW: 12.9 % (ref 11.5–15.5)
WBC: 8.2 10*3/uL (ref 4.0–10.5)
WBC: 4 10*3/uL (ref 4.0–10.5)
nRBC: 0 % (ref 0.0–0.2)
nRBC: 0 % (ref 0.0–0.2)

## 2021-02-11 LAB — RESP PANEL BY RT-PCR (FLU A&B, COVID) ARPGX2
Influenza A by PCR: NEGATIVE
Influenza B by PCR: NEGATIVE
SARS Coronavirus 2 by RT PCR: NEGATIVE

## 2021-02-11 LAB — LIPASE, BLOOD: Lipase: 18 U/L (ref 11–51)

## 2021-02-11 LAB — COMPREHENSIVE METABOLIC PANEL
ALT: 12 U/L (ref 0–44)
ALT: 9 U/L (ref 0–44)
AST: 15 U/L (ref 15–41)
AST: 18 U/L (ref 15–41)
Albumin: 2.9 g/dL — ABNORMAL LOW (ref 3.5–5.0)
Albumin: 2.4 g/dL — ABNORMAL LOW (ref 3.5–5.0)
Alkaline Phosphatase: 39 U/L (ref 38–126)
Alkaline Phosphatase: 35 U/L — ABNORMAL LOW (ref 38–126)
Anion gap: 8 (ref 5–15)
Anion gap: 8 (ref 5–15)
BUN: 34 mg/dL — ABNORMAL HIGH (ref 6–20)
BUN: 35 mg/dL — ABNORMAL HIGH (ref 6–20)
CO2: 21 mmol/L — ABNORMAL LOW (ref 22–32)
CO2: 21 mmol/L — ABNORMAL LOW (ref 22–32)
Calcium: 8.7 mg/dL — ABNORMAL LOW (ref 8.9–10.3)
Calcium: 8.4 mg/dL — ABNORMAL LOW (ref 8.9–10.3)
Chloride: 115 mmol/L — ABNORMAL HIGH (ref 98–111)
Chloride: 114 mmol/L — ABNORMAL HIGH (ref 98–111)
Creatinine, Ser: 1.24 mg/dL (ref 0.61–1.24)
Creatinine, Ser: 1.6 mg/dL — ABNORMAL HIGH (ref 0.61–1.24)
GFR, Estimated: >60 mL/min (ref >60)
GFR, Estimated: 52 mL/min — ABNORMAL LOW (ref >60)
Glucose, Bld: 134 mg/dL — ABNORMAL HIGH (ref 70–99)
Glucose, Bld: 145 mg/dL — ABNORMAL HIGH (ref 70–99)
Potassium: 3.8 mmol/L (ref 3.5–5.1)
Potassium: 2.8 mmol/L — ABNORMAL LOW (ref 3.5–5.1)
Sodium: 144 mmol/L (ref 135–145)
Sodium: 143 mmol/L (ref 135–145)
Total Bilirubin: 0.8 mg/dL (ref 0.3–1.2)
Total Bilirubin: 0.6 mg/dL (ref 0.3–1.2)
Total Protein: 5.8 g/dL — ABNORMAL LOW (ref 6.5–8.1)
Total Protein: 5.3 g/dL — ABNORMAL LOW (ref 6.5–8.1)

## 2021-02-11 LAB — POCT I-STAT 7, (LYTES, BLD GAS, ICA,H+H)
Acid-base deficit: 1 mmol/L (ref 0.0–2.0)
Acid-base deficit: 2 mmol/L (ref 0.0–2.0)
Acid-base deficit: 4 mmol/L — ABNORMAL HIGH (ref 0.0–2.0)
Bicarbonate: 21 mmol/L (ref 20.0–28.0)
Bicarbonate: 22.7 mmol/L (ref 20.0–28.0)
Bicarbonate: 25.6 mmol/L (ref 20.0–28.0)
Calcium, Ion: 1.24 mmol/L (ref 1.15–1.40)
Calcium, Ion: 1.29 mmol/L (ref 1.15–1.40)
Calcium, Ion: 1.31 mmol/L (ref 1.15–1.40)
HCT: 28 % — ABNORMAL LOW (ref 39.0–52.0)
HCT: 28 % — ABNORMAL LOW (ref 39.0–52.0)
HCT: 30 % — ABNORMAL LOW (ref 39.0–52.0)
Hemoglobin: 10.2 g/dL — ABNORMAL LOW (ref 13.0–17.0)
Hemoglobin: 9.5 g/dL — ABNORMAL LOW (ref 13.0–17.0)
Hemoglobin: 9.5 g/dL — ABNORMAL LOW (ref 13.0–17.0)
O2 Saturation: 98 %
O2 Saturation: 98 %
O2 Saturation: 99 %
Patient temperature: 35
Patient temperature: 97.5
Patient temperature: 97.5
Potassium: 3.5 mmol/L (ref 3.5–5.1)
Potassium: 3.5 mmol/L (ref 3.5–5.1)
Potassium: 3.7 mmol/L (ref 3.5–5.1)
Sodium: 146 mmol/L — ABNORMAL HIGH (ref 135–145)
Sodium: 147 mmol/L — ABNORMAL HIGH (ref 135–145)
Sodium: 149 mmol/L — ABNORMAL HIGH (ref 135–145)
TCO2: 22 mmol/L (ref 22–32)
TCO2: 24 mmol/L (ref 22–32)
TCO2: 27 mmol/L (ref 22–32)
pCO2 arterial: 33.7 mmHg (ref 32.0–48.0)
pCO2 arterial: 34.4 mmHg (ref 32.0–48.0)
pCO2 arterial: 57.8 mmHg — ABNORMAL HIGH (ref 32.0–48.0)
pH, Arterial: 7.251 — ABNORMAL LOW (ref 7.350–7.450)
pH, Arterial: 7.384 (ref 7.350–7.450)
pH, Arterial: 7.434 (ref 7.350–7.450)
pO2, Arterial: 120 mmHg — ABNORMAL HIGH (ref 83.0–108.0)
pO2, Arterial: 133 mmHg — ABNORMAL HIGH (ref 83.0–108.0)
pO2, Arterial: 98 mmHg (ref 83.0–108.0)

## 2021-02-11 LAB — URINALYSIS, COMPLETE (UACMP) WITH MICROSCOPIC
Bacteria, UA: NONE SEEN
Bilirubin Urine: NEGATIVE
Glucose, UA: NEGATIVE mg/dL
Ketones, ur: NEGATIVE mg/dL
Leukocytes,Ua: NEGATIVE
Nitrite: NEGATIVE
Protein, ur: NEGATIVE mg/dL
Specific Gravity, Urine: 1.01 (ref 1.005–1.030)
pH: 6 (ref 5.0–8.0)

## 2021-02-11 LAB — TROPONIN I (HIGH SENSITIVITY): Troponin I (High Sensitivity): 7 ng/L (ref <18)

## 2021-02-11 LAB — TYPE AND SCREEN
ABO/RH(D): A POS
Antibody Screen: NEGATIVE
PT AG Type: POSITIVE

## 2021-02-11 LAB — FIBRINOGEN: Fibrinogen: 711 mg/dL — ABNORMAL HIGH (ref 210–475)

## 2021-02-11 LAB — GLUCOSE, CAPILLARY
Glucose-Capillary: 103 mg/dL — ABNORMAL HIGH (ref 70–99)
Glucose-Capillary: 106 mg/dL — ABNORMAL HIGH (ref 70–99)
Glucose-Capillary: 112 mg/dL — ABNORMAL HIGH (ref 70–99)
Glucose-Capillary: 123 mg/dL — ABNORMAL HIGH (ref 70–99)

## 2021-02-11 LAB — PROTIME-INR
INR: 1.3 — ABNORMAL HIGH (ref 0.8–1.2)
Prothrombin Time: 15.7 seconds — ABNORMAL HIGH (ref 11.4–15.2)

## 2021-02-11 LAB — ABO/RH: ABO/RH(D): A POS

## 2021-02-11 LAB — AMYLASE: Amylase: 149 U/L — ABNORMAL HIGH (ref 28–100)

## 2021-02-11 LAB — APTT: aPTT: 34 seconds (ref 24–36)

## 2021-02-11 LAB — CK TOTAL AND CKMB (NOT AT ARMC)
CK, MB: 2.1 ng/mL (ref 0.5–5.0)
Relative Index: 1.4 (ref 0.0–2.5)
Total CK: 155 U/L (ref 49–397)

## 2021-02-11 LAB — BILIRUBIN, DIRECT: Bilirubin, Direct: 0.2 mg/dL (ref 0.0–0.2)

## 2021-02-11 IMAGING — DX DG CHEST 1V PORT
1 series · 1 of 1 positions shown · non-contrast
Comparison: [DATE]

CLINICAL DATA: Check central line placement

EXAM:
PORTABLE CHEST 1 VIEW

[chest ap]
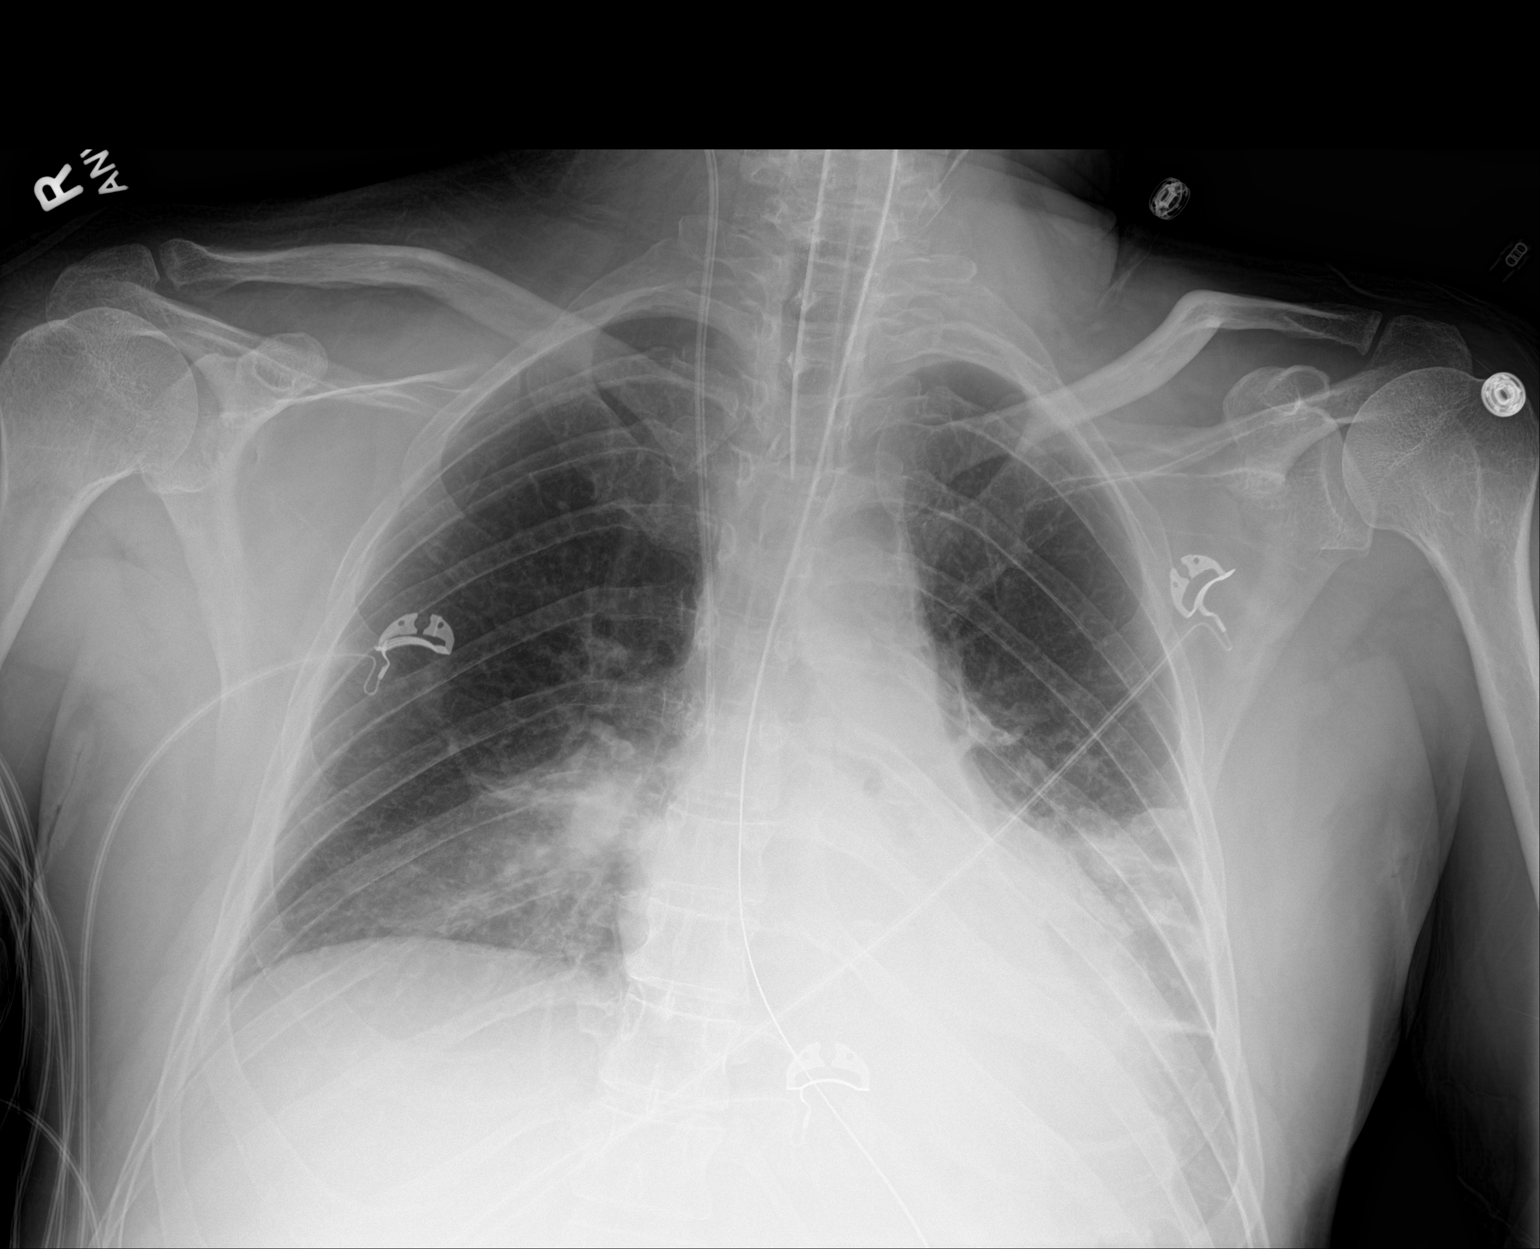

[1 of 1 positions shown; findings below may reference images not displayed]

FINDINGS: Cardiac shadow is stable. Endotracheal tube and gastric catheter are
noted in satisfactory position. New right jugular central line is
noted at the cavoatrial junction. No pneumothorax is noted. Patchy
airspace opacities are noted in the bases bilaterally slightly
improved on the left when compared with the prior exam.
IMPRESSION: No pneumothorax following central line placement.

Bibasilar patchy airspace opacities left greater than right but
improved when compared with the prior study.

## 2021-02-11 IMAGING — DX DG CHEST 1V PORT
1 series · 1 of 1 positions shown · non-contrast
Comparison: [DATE]

CLINICAL DATA: Respiratory failure

EXAM:
PORTABLE CHEST 1 VIEW

[chest ap]
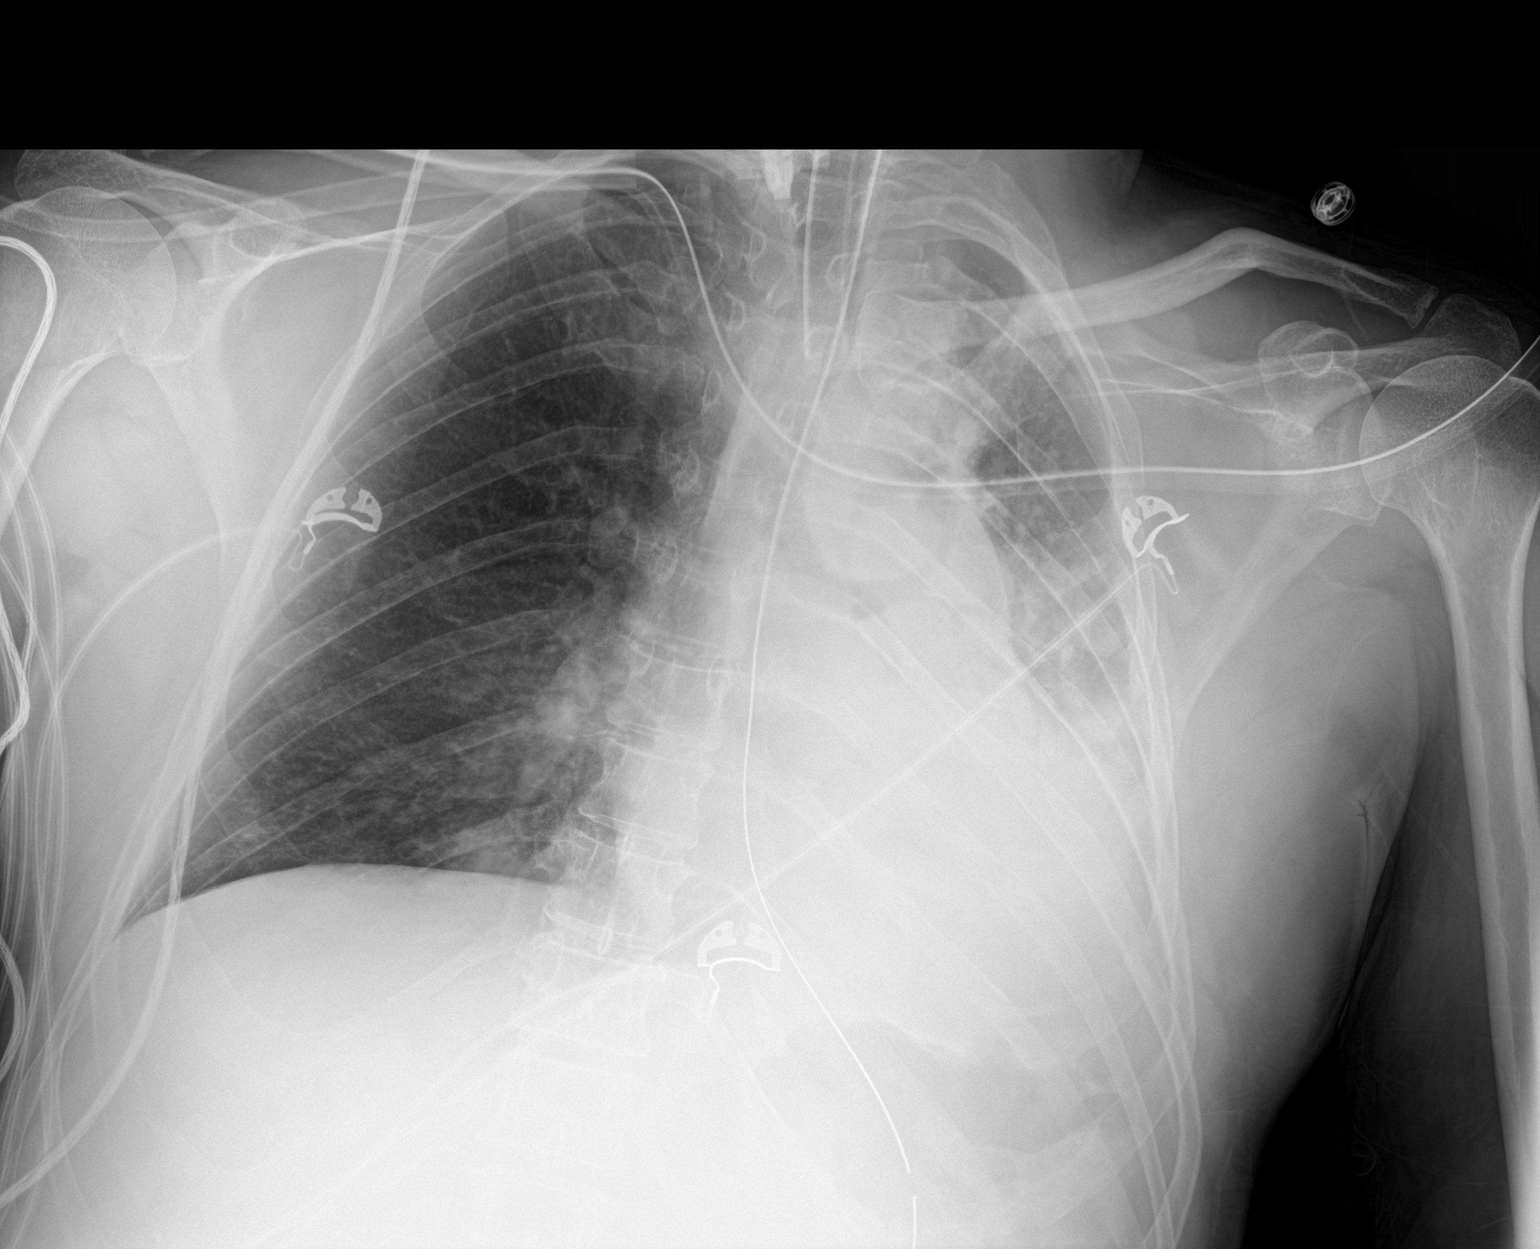

[1 of 1 positions shown; findings below may reference images not displayed]

FINDINGS: Endotracheal tube 5.2 cm above the carina. Nasogastric tube extends
into the upper abdomen beyond the margin of the examination.
Consolidation within the left mid and lower lung zone has developed,
aspiration versus infection. Small left pleural effusion is
suspected. Right lung is clear. No pneumothorax. Cardiac size is
within normal limits.
IMPRESSION: Stable support tubes.

Interval development of left mid and lower lung zone consolidation.
Possible superimposed small left pleural effusion.

## 2021-02-11 IMAGING — CT CT CHEST-ABD-PELV W/O CM
2 of 5 series · 13 of 46 positions shown, 15 images · non-contrast
Comparison: None.

CLINICAL DATA: Organ procurement.

EXAM:
CT CHEST, ABDOMEN AND PELVIS WITHOUT CONTRAST
TECHNIQUE: Multidetector CT imaging of the chest, abdomen and pelvis was
performed following the standard protocol without IV contrast.

[Series 4: cap w/o 2.0 mm st · axial · non-contrast · 0.85mm/px · z∈[+924,+1508]mm · 10 of 332 slices shown, 12 images]
[im 20/332  soft-tissue]
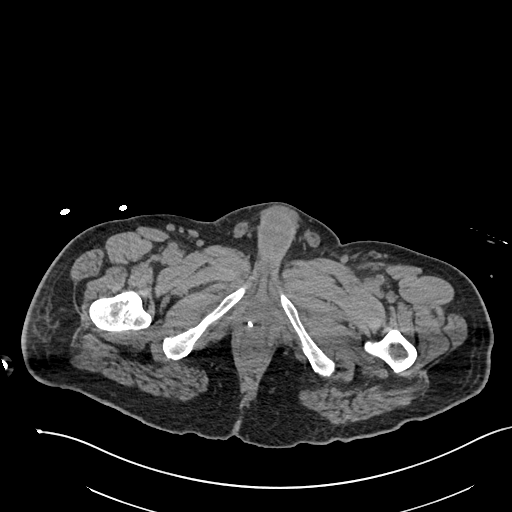
[im 20/332  bone]
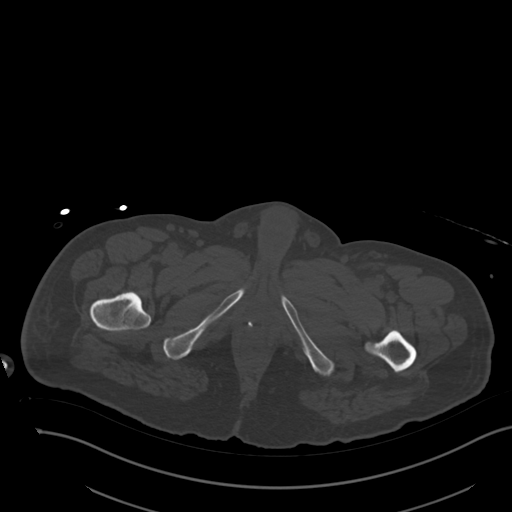
[im 59/332  soft-tissue]
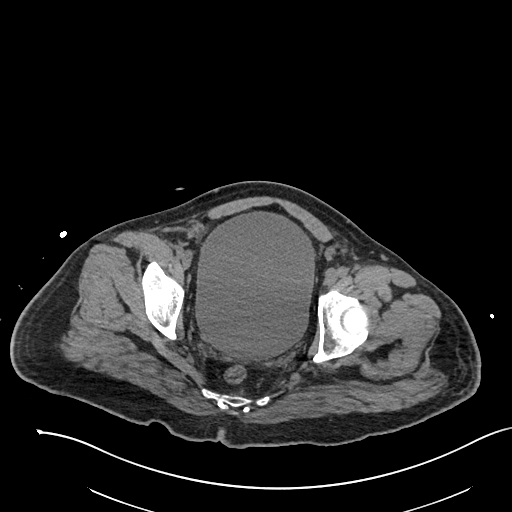
[im 98/332  soft-tissue]
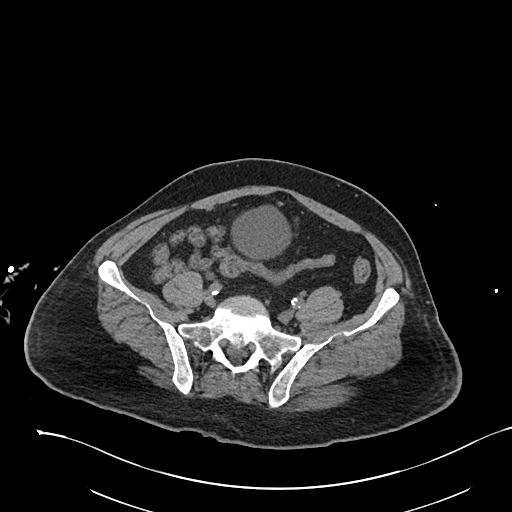
[im 117/332  soft-tissue]
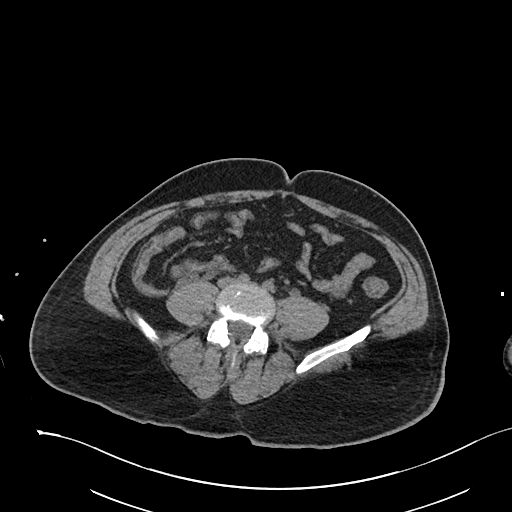
[im 156/332  soft-tissue]
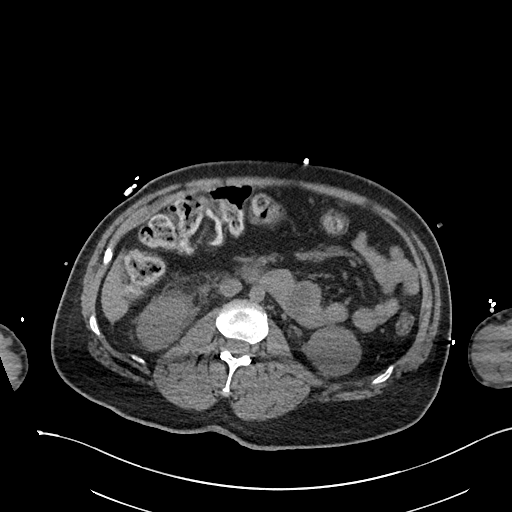
[im 176/332  soft-tissue]
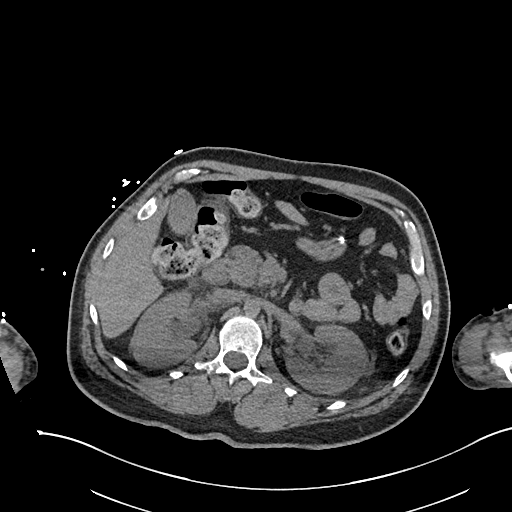
[im 215/332  soft-tissue]
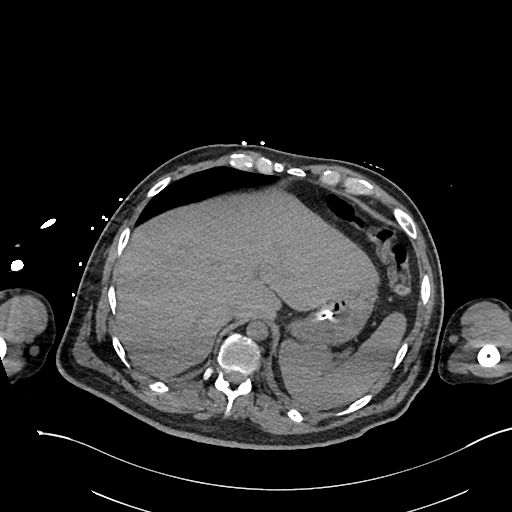
[im 254/332  soft-tissue]
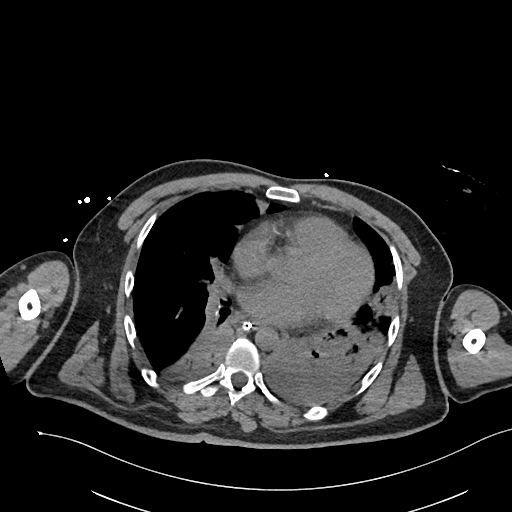
[im 273/332  soft-tissue]
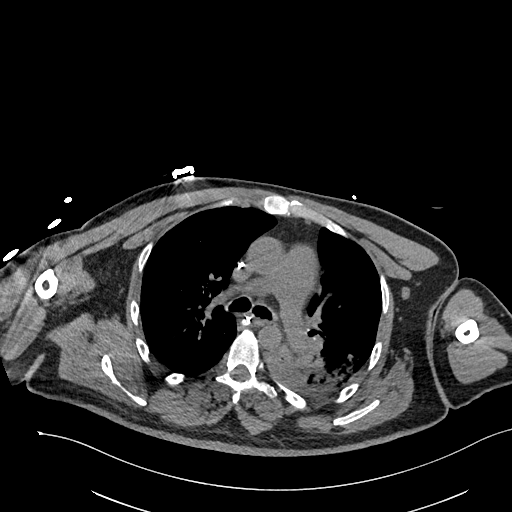
[im 273/332  bone]
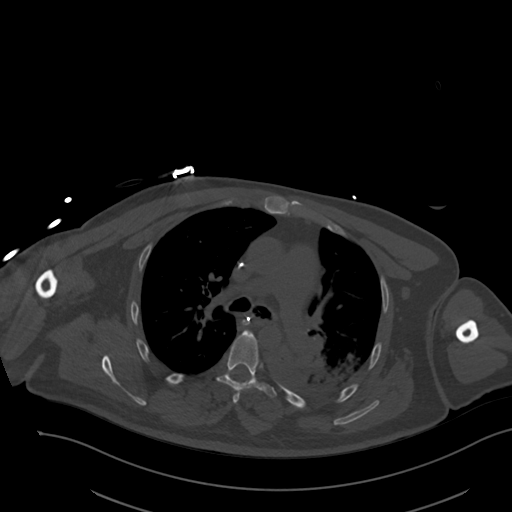
[im 312/332  soft-tissue]
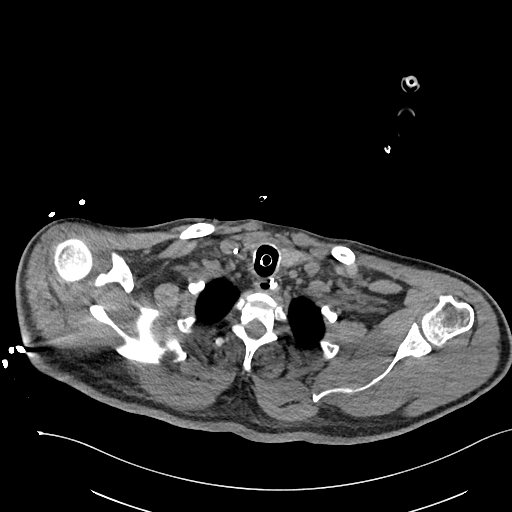

[Series 7: cap w/o 3.0 mm st cor · coronal · non-contrast · 0.65mm/px · 3 of 90 slices shown]
[im 30/90  soft-tissue]
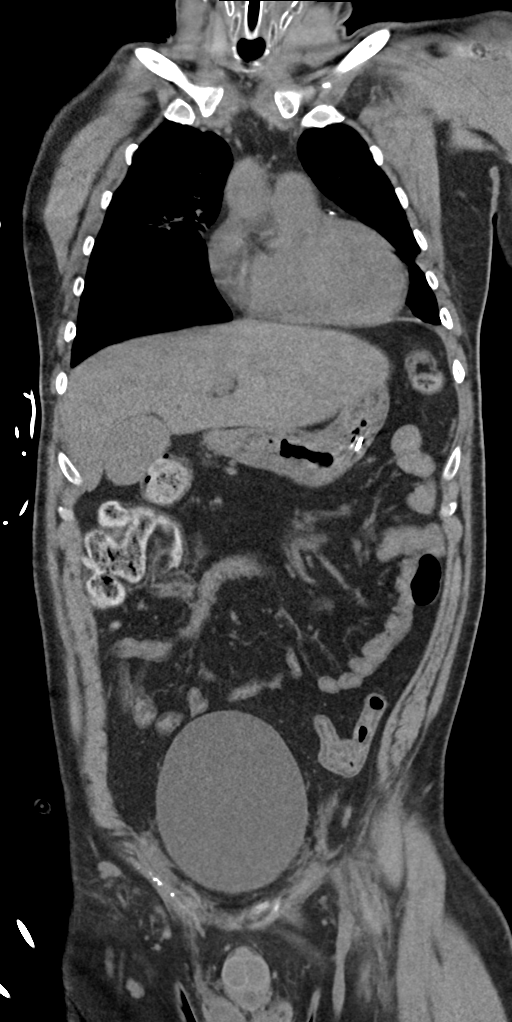
[im 40/90  soft-tissue]
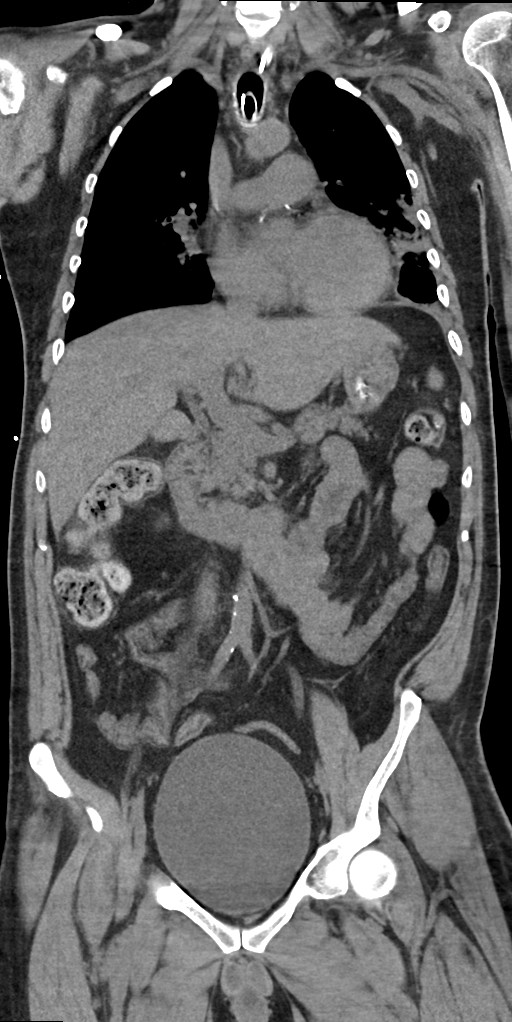
[im 50/90  soft-tissue]
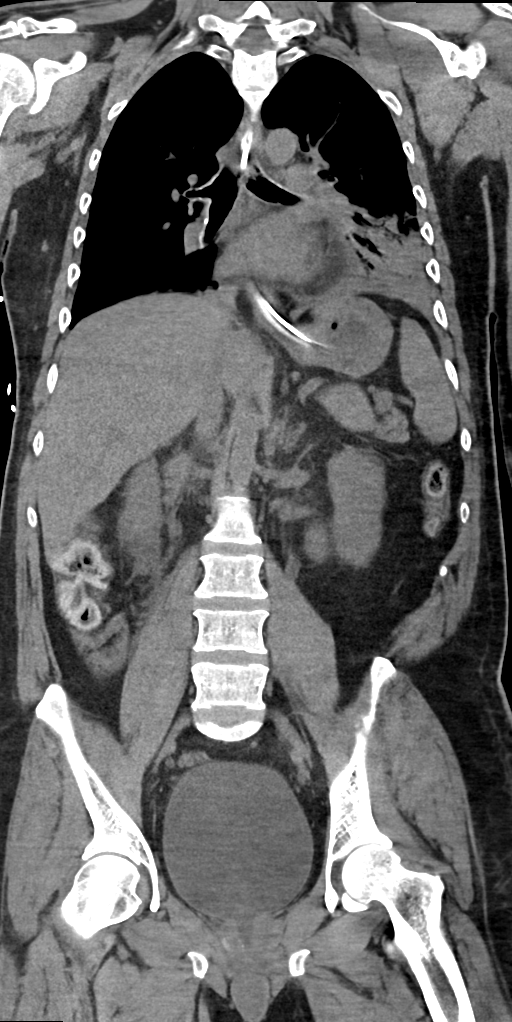

[13 of 46 positions shown; findings below may reference images not displayed]

FINDINGS: CT CHEST FINDINGS

Cardiovascular: The heart is mildly enlarged. There is no
pericardial effusion. Aorta is normal in size. Coronary artery
calcifications are present. There is a central venous catheter with
distal tip in the distal SVC.

Mediastinum/Nodes: There are nonenlarged mediastinal lymph nodes
diffusely. Bilateral hilar lymphadenopathy is difficult to exclude
secondary to lack of contrast. There is likely left hilar
adenopathy.

Enteric tube is seen throughout nondilated esophagus. Visualized
thyroid gland is within normal limits.

Lungs/Pleura: There is dense airspace consolidation throughout the
entire left lower lobe and in the inferior left upper lobe. There is
also dense consolidation throughout the entire right lower lobe with
an element of collapse/atelectasis. There is additional patchy
airspace disease in the anterior right upper lobe. There is no
evidence for pneumothorax. There is likely a small left pleural
effusion. Secretions are seen at the level of the carina, distal
trachea and left mainstem bronchus. Endotracheal tube tip is 3.1 cm
above the carina.

Musculoskeletal: No chest wall mass or suspicious bone lesions
identified.

CT ABDOMEN PELVIS FINDINGS

Hepatobiliary: No focal liver abnormality is seen. No gallstones,
gallbladder wall thickening, or biliary dilatation.

Pancreas: Unremarkable. No pancreatic ductal dilatation or
surrounding inflammatory changes.

Spleen: Normal in size without focal abnormality.

Adrenals/Urinary Tract: There is mild bilateral hydronephrosis. No
obstructing calculi are seen. There is right perinephric and Peri
ureteral fat stranding. The bladder is distended. The bladder
otherwise appears within normal limits. Adrenal glands are within
normal limits. No focal renal lesions are seen on this noncontrast
study.

Stomach/Bowel: No evidence of bowel wall thickening, distention, or
inflammatory changes. The appendix is not seen. Nasogastric tube tip
is in the mid stomach. The stomach is decompressed.

Vascular/Lymphatic: Aortic atherosclerosis. No enlarged abdominal or
pelvic lymph nodes.

Reproductive: Prostate gland is prominent in size. TURP defect
present.

Other: There is no ascites or free air. There is mild fat stranding
in the left inguinal region. There is no focal abdominal wall
hernia.

Musculoskeletal: No acute or significant osseous findings.
IMPRESSION: 1. Extensive bilateral lower lobe consolidation with patchy airspace
disease in the bilateral upper lobes. Findings are worrisome for
multifocal pneumonia. Underlying lesions can not be excluded.
2. Small left pleural effusion.
3. Mild bilateral hydroureteronephrosis with marked bladder
distension. No obstructing calculi identified. Findings may be
related to bladder outlet obstruction. There is mild right
perinephric and periureteral stranding which can be seen in the
setting of infection. Correlate clinically.
4. Prostate gland is prominent in size.  TURP defect present.
5. Mild stranding in the left inguinal region, nonspecific.

## 2021-02-11 MED ORDER — LACTATED RINGERS IV BOLUS
500.0000 mL | Freq: Once | INTRAVENOUS | Status: AC
  Administered 2021-02-11: 500 mL via INTRAVENOUS

## 2021-02-11 MED ORDER — POTASSIUM CHLORIDE 10 MEQ/100ML IV SOLN
10.0000 meq | INTRAVENOUS | Status: AC
  Administered 2021-02-12 (×2): 10 meq via INTRAVENOUS
  Filled 2021-02-11 (×2): qty 100

## 2021-02-11 MED ORDER — SODIUM CHLORIDE 0.9 % IV SOLN
250.0000 mL | INTRAVENOUS | Status: DC
  Administered 2021-02-11 – 2021-02-13 (×3): 250 mL via INTRAVENOUS

## 2021-02-11 MED ORDER — SODIUM CHLORIDE 0.9 % IV BOLUS
1500.0000 mL | Freq: Once | INTRAVENOUS | Status: AC
  Administered 2021-02-10: 1500 mL via INTRAVENOUS

## 2021-02-11 MED ORDER — VASOPRESSIN 20 UNITS/100 ML INFUSION FOR SHOCK
0.0000 [IU]/min | INTRAVENOUS | Status: DC
  Administered 2021-02-11: 0.03 [IU]/min via INTRAVENOUS
  Filled 2021-02-11: qty 100

## 2021-02-11 MED ORDER — NOREPINEPHRINE 4 MG/250ML-% IV SOLN: 0.0000 ug/min | INTRAVENOUS | Status: DC

## 2021-02-11 MED ORDER — ALBUMIN HUMAN 5 % IV SOLN
12.5000 g | Freq: Once | INTRAVENOUS | Status: AC
Start: 2021-02-12 — End: 2021-02-12
  Administered 2021-02-12: 12.5 g via INTRAVENOUS
  Filled 2021-02-11: qty 250

## 2021-02-11 MED ORDER — NOREPINEPHRINE 4 MG/250ML-% IV SOLN
2.0000 ug/min | INTRAVENOUS | Status: DC
  Administered 2021-02-11: 3 ug/min via INTRAVENOUS
  Filled 2021-02-11 (×2): qty 250

## 2021-02-11 MED ORDER — SODIUM CHLORIDE 0.9 % IV SOLN
2000.0000 mg | Freq: Once | INTRAVENOUS | Status: AC
  Administered 2021-02-11: 2000 mg via INTRAVENOUS
  Filled 2021-02-11: qty 32

## 2021-02-11 MED ORDER — SODIUM CHLORIDE 0.9 % IV SOLN
10.0000 ug/h | INTRAVENOUS | Status: DC
  Administered 2021-02-11 – 2021-02-13 (×4): 20 ug/h via INTRAVENOUS
  Filled 2021-02-11 (×6): qty 10

## 2021-02-11 MED ORDER — INSULIN ASPART 100 UNIT/ML IV SOLN
20.0000 [IU] | Freq: Once | INTRAVENOUS | Status: AC
  Administered 2021-02-11: 20 [IU] via INTRAVENOUS

## 2021-02-11 MED ORDER — DEXTROSE 50 % IV SOLN
1.0000 | Freq: Once | INTRAVENOUS | Status: AC
  Administered 2021-02-11: 50 mL via INTRAVENOUS
  Filled 2021-02-11: qty 50

## 2021-02-11 NOTE — Procedures (Signed)
Adult Brain Death Determination  Time of Examination: 02/10/2021 2:17 PM  No Evidence of /Cause of Reversible CNS Depression  Core temperature must be greater >36 degrees. Last temp: Temp: (!) 97.5 F (36.4 C)  (Note: If unable to achieve normothermia after 12 hours of temperature management, may consider proceeding with Brain Death Evaluation.):    yes  Evidence of severe metabolic perturbations that could potentate CNS depression. Consider glucose, Na, creatinine, PaCO2, SaO2.:    Absent  Evidence of drugs, by history or measurement, that could potentiate central nervous system depression: narcotics, ethanol, benzodiazepines, barbiturates, neuromuscular blockade.:     Absent  Absence of Cortical Function  GCS = 3:    yes  Absence of Brain Stem Reflexes and Responses  Pupils light-fixed    yes  Corneal reflexes:    Absent  Response to upper and lower airway stimulation, such as pharyngeal and endotracheal suctioning.:    Absent  Ocular response to head turning (eye movement).    Absent  Absence of Spontaneous Respirations  (Apnea test performed per Brain Death Policy. If not met due to hemodynamic/ventilatory instability, then perform EEG, TCD, or cerebral blow flow studies.)  1.   Spontaneous Respirations   Absent  2.   PaCO2 at start of apnea test:  33.7  3.   PaCO2 at end of apnea test:  57.8  4.   CO2 rise of 20 or greater from baseline:   yes  Document Confirmatory Test Utilized: (Optional) Nuclear cerebral flow, cerebral angiography (CT/MR angio), transcranial Doppler ultrasound, EEG, SSEP (record results).  Test results (if available):  MRI extensive brainstem infarction and edema  Patient pronounced dead by neurological criteria at 14:18 on 02/04/2021.  Candee Furbish, MD 02/04/2021 2:17 PM

## 2021-02-11 NOTE — Progress Notes (Signed)
RT NOTE: Apnea test performed with RT X 2 and CCM MD at bedside. Initial ABG results before apnea test: 7.43/33.7/137/22.7. Apnea test initiated and ABG obtained after 8 minutes with following results: 7.25/57.8/120/25.6. Upon completion of apnea test patient placed back on ventilator with current settings. Vitals are currently stable. RT will continue to monitor.

## 2021-02-11 NOTE — Procedures (Signed)
Arterial Catheter Insertion Procedure Note  Richard Oneal  [id number]  May 03, 1970  Date:02/16/2021  Time:6:24 PM    Provider Performing: Leanna Sato [name]    Procedure: Insertion of Arterial Line (69629) with US guidance ([id number])   Indication(s) Blood pressure monitoring and/or need for frequent ABGs  Consent Risks of the procedure as well as the alternatives and risks of each were explained to the patient and/or caregiver.  Consent for the procedure was obtained and is signed in the bedside chart  Anesthesia None   Time Out Verified patient identification, verified procedure, site/side was marked, verified correct patient position, special equipment/implants available, medications/allergies/relevant history reviewed, required imaging and test results available.   Sterile Technique Maximal sterile technique including full sterile barrier drape, hand hygiene, sterile gown, sterile gloves, mask, hair covering, sterile ultrasound probe cover (if used).   Procedure Description Area of catheter insertion was cleaned with chlorhexidine and draped in sterile fashion. With real-time ultrasound guidance an arterial catheter was placed into the right radial artery.  Appropriate arterial tracings confirmed on monitor.     Complications/Tolerance Unable to obtain access via right femoral x3 attempts   EBL Minimal   Specimen(s) None

## 2021-02-11 NOTE — Progress Notes (Signed)
° °  Palliative Medicine Inpatient Follow Up Note  Per chart review patient's family has elected to make [facility] a DO NOT RESUSCITATE CODE STATUS.    The plan at this point is to maintain stability for organ donation  Palliative care will not see this patient per conversation with Dr. Ned Card  Please do not hesitate to reach out if additional palliative support is needed  No Charge ______________________________________________________________________________________ Richard Oneal [place] Palliative Medicine Team Team Cell Phone: [phone] Please utilize secure chat with additional questions, if there is no response within 30 minutes please call the above phone number  Palliative Medicine Team providers are available by phone from 7am to 7pm daily and can be reached through the team cell phone.  Should this patient require assistance outside of these hours, please call the patient's attending physician.

## 2021-02-11 NOTE — Procedures (Signed)
Arterial Catheter Insertion Procedure Note  Richard Oneal  [id number]  05/03/1970  Date:02/15/2021  Time:6:25 PM    Provider Performing: Leanna Sato Kikue Gerhart    Procedure: Insertion of Arterial Line (31594) with US guidance (58592)   Indication(s) Blood pressure monitoring and/or need for frequent ABGs  Consent Risks of the procedure as well as the alternatives and risks of each were explained to the patient and/or caregiver.  Consent for the procedure was obtained and is signed in the bedside chart  Anesthesia None   Time Out Verified patient identification, verified procedure, site/side was marked, verified correct patient position, special equipment/implants available, medications/allergies/relevant history reviewed, required imaging and test results available.   Sterile Technique Maximal sterile technique including full sterile barrier drape, hand hygiene, sterile gown, sterile gloves, mask, hair covering, sterile ultrasound probe cover (if used).   Procedure Description Area of catheter insertion was cleaned with chlorhexidine and draped in sterile fashion. With real-time ultrasound guidance an arterial catheter was placed into the left femoral artery.  Appropriate arterial tracings confirmed on monitor.     Complications/Tolerance Unable to obtain access x2 attempts   EBL Minimal   Specimen(s) None

## 2021-02-11 NOTE — Plan of Care (Signed)
°  Problem: Education: Goal: Knowledge of General Education information will improve Description: Including pain rating scale, medication(s)/side effects and non-pharmacologic comfort measures Outcome: Not Progressing   Problem: Health Behavior/Discharge Planning: Goal: Ability to manage health-related needs will improve Outcome: Not Progressing   Problem: Clinical Measurements: Goal: Ability to maintain clinical measurements within normal limits will improve Outcome: Not Progressing Goal: Will remain free from infection Outcome: Not Progressing Goal: Diagnostic test results will improve Outcome: Not Progressing Goal: Respiratory complications will improve Outcome: Not Progressing Goal: Cardiovascular complication will be avoided Outcome: Not Progressing   Problem: Activity: Goal: Risk for activity intolerance will decrease Outcome: Not Progressing   Problem: Nutrition: Goal: Adequate nutrition will be maintained Outcome: Not Progressing   Problem: Coping: Goal: Level of anxiety will decrease Outcome: Not Progressing   Problem: Elimination: Goal: Will not experience complications related to bowel motility Outcome: Not Progressing Goal: Will not experience complications related to urinary retention Outcome: Not Progressing   Problem: Pain Managment: Goal: General experience of comfort will improve Outcome: Not Progressing   Problem: Safety: Goal: Ability to remain free from injury will improve Outcome: Not Progressing   Problem: Skin Integrity: Goal: Risk for impaired skin integrity will decrease Outcome: Not Progressing   Problem: Education: Goal: Knowledge of secondary prevention will improve (SELECT ALL) Outcome: Not Progressing   Problem: Nutrition: Goal: Risk of aspiration will decrease Outcome: Not Progressing

## 2021-02-11 NOTE — Progress Notes (Signed)
RT NOTE:    Pt transported to CT and back to 2M14 without event.

## 2021-02-11 NOTE — Progress Notes (Signed)
STROKE TEAM PROGRESS NOTE   INTERVAL HISTORY Sister is at the bedside. Pt still intubated but has lost all brainstem function and reflexes, not breath over the vent. Family has decided comfort care measures and organ donation.   Vitals:   11-Mar-2021 1103 Mar 11, 2021 1118 2021/03/11 1200 Mar 11, 2021 1300  BP:   115/73 124/78  Pulse:   81 82  Resp:   16   Temp:  (!) 97.5 F (36.4 C)    TempSrc:  Axillary    SpO2: 100%  100%   Weight:      Height:       CBC:  Recent Labs  Lab 02/09/21 0629 02/10/21 0248 02/10/21 1314  WBC 13.6* 17.1* 17.4*  NEUTROABS 10.5* 13.7*  --   HGB 14.8 15.3 14.4  HCT 42.8 44.8 40.0  MCV 90.1 93.3 89.1  PLT 247 173 212   Basic Metabolic Panel:  Recent Labs  Lab 02/09/21 0629 02/10/21 0248 02/10/21 0644  NA 134* 138  --   K 3.3* 4.1  --   CL 102 107  --   CO2 22 19*  --   GLUCOSE 116* 131*  --   BUN 17 21*  --   CREATININE 1.03 1.01  --   CALCIUM 9.1 8.9  --   MG 2.0  --  2.2   Lipid Panel:  Recent Labs  Lab 02/09/21 0629 02/10/21 0644  CHOL 157  --   TRIG 315*   321* 206*  HDL 40*  --   CHOLHDL 3.9  --   VLDL 63*  --   LDLCALC 54  --    HgbA1c:  Recent Labs  Lab 02/09/21 0629  HGBA1C 4.9   Urine Drug Screen:  Recent Labs  Lab 02/22/2021 1533  LABOPIA NONE DETECTED  COCAINSCRNUR NONE DETECTED  LABBENZ NONE DETECTED  AMPHETMU NONE DETECTED  THCU POSITIVE*  LABBARB NONE DETECTED    Alcohol Level No results for input(s): ETH in the last 168 hours.  IMAGING past 24 hours No results found.  PHYSICAL EXAM  Temp:  [97.5 F (36.4 C)-100.8 F (38.2 C)] 97.5 F (36.4 C) (12/16 1118) Pulse Rate:  [75-132] 82 (12/16 1300) Resp:  [14-25] 16 (12/16 1200) BP: (35-149)/(25-85) 124/78 (12/16 1300) SpO2:  [96 %-100 %] 100 % (12/16 1200) FiO2 (%):  [40 %] 40 % (12/16 1200)  General: Intubated, well-developed male in no acute distress.  CV: Regular rate and rhythm  Respiratory:  on vent, not breathing over the vent.   Neuro -  intubated off sedation, eyes closed, not following commands. With forced eye opening, eyes in mid position, not blinking to visual threat, doll's eyes absent, not tracking, no pupillary reflexes bilaterally. Corneal reflex absent, gag and cough absent. Caloric test negative for nystagmus. Breathing not over the vent.  Facial symmetry not able to test due to ET tube.  Tongue protrusion not cooperative. On pain stimulation, no movement of all extremities. No babinski. Exam consistent with clinical brain death.    ASSESSMENT/PLAN [name] is a 50 y.o. male with history of HLD and cognitive impairment presenting with gait imbalance, headache, slurred speech and left-sided numbness.  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  This procedure was unfortunately unsuccessful.  Patient remained intubated after the procedure and was admitted to the ICU.  His neurological exam has worsened since yesterday afternoon.MRI today shows extensive infarctions in the cerebellum and pons along with  mild hydrocephalus.  Changes and prognosis were discussed with patient's family, who wished to proceed with aggressive care.  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 Now in brain death.   Hydrocephalus New mild hydrocephalus on MRI today NSG does not feel the procedure will change prognosis Family requested DNR, now pt in brain death  Hx of hypertension Hypotension Home meds:  none Unstable On levophed now  Hyperlipidemia Home meds:  rosuvastatin 10 mg daily, resumed in hospital LDL 54, goal < 70  Respiratory failure Patient remains intubated after procedure Worsening neuro exam Unable to extubate due to poor neurological status Now breathing not over the vent due to brain death  Recurrent vomiting Possibly r/t extension of cerebellar / brainstem stroke OG tube wall suctioning   Other Stroke Risk Factors Substance abuse - UDS:  THC POSITIVE. Patient advised to stop using due to stroke risk. Family hx stroke   Other Active Problems Hx of speech disorder  Stress ulcer - coffee ground emesis   Hospital day # 3  This patient is critically ill due to extensive posterior circulation strokes and brain death. This patient's care requires constant monitoring of vital signs, hemodynamics, respiratory and cardiac monitoring, review of multiple databases, neurological assessment, discussion with family, other specialists and medical decision making of high complexity. I spent 35 minutes of neurocritical care time in the care of this patient. I had long discussion with sister at bedside, updated pt current condition, treatment plan and potential prognosis, and answered all the questions. She expressed understanding and appreciation.    Marvel Plan, MD PhD Stroke Neurology 03/08/2021 1:08 PM       To contact Stroke Continuity provider, please refer to [email or web address]. After hours, contact General Neurology

## 2021-02-11 NOTE — Progress Notes (Signed)
RT NOTE:  Per request of [place] lung recruitment was done x 30 seconds & FiO2 was increased to 100%. Will collected ABG in 30 minutes.

## 2021-02-11 NOTE — Progress Notes (Signed)
Nutrition Brief Note  Chart reviewed. Patient is transitioning to comfort care. Patient's family wishes to pursue organ donation.  No further nutrition interventions planned at this time.  Please re-consult as needed.   Gabriel Rainwater, RD, LDN, CNSC Please refer to [facility] for contact information.

## 2021-02-11 NOTE — Procedures (Addendum)
Central Venous Catheter Insertion Procedure Note  Richard Oneal  [id number]  02-13-1971  Date:02/09/2021  Time:8:15 PM   Provider Performing:Chrysten Woulfe D Suzie Portela   Procedure: Insertion of Non-tunneled Central Venous [id number]) with US guidance ([id number])   Indication(s) Difficult access; blood draw for donor services  Consent Risks of the procedure as well as the alternatives and risks of each were explained to the patient and/or caregiver.  Consent for the procedure was obtained and is signed in the bedside chart  Anesthesia Topical only with 1% lidocaine   Timeout Verified patient identification, verified procedure, site/side was marked, verified correct patient position, special equipment/implants available, medications/allergies/relevant history reviewed, required imaging and test results available.  Sterile Technique Maximal sterile technique including full sterile barrier drape, hand hygiene, sterile gown, sterile gloves, mask, hair covering, sterile ultrasound probe cover (if used).  Procedure Description Area of catheter insertion was cleaned with chlorhexidine and draped in sterile fashion.  With real-time ultrasound guidance a central venous catheter was placed into the right internal jugular vein. Nonpulsatile blood flow and easy flushing noted in all ports.  The catheter was sutured in place and sterile dressing applied.     Complications/Tolerance None; patient tolerated the procedure well. Chest X-ray is ordered to verify placement for internal jugular or subclavian cannulation.   Chest x-ray is not ordered for femoral cannulation.  EBL Minimal  Specimen(s) None  JD Anselm Oneal Richard Oneal 02/17/2021, 8:16 PM  Please see [email or web address] for pager details.  From 7A-7P if no response, please call [phone]. After hours, please call ELink [phone].

## 2021-02-11 NOTE — Progress Notes (Signed)
NAME:  Richard Oneal, MRN:  [id number], DOB:  1970/08/15, LOS: 3 ADMISSION DATE:  02/02/2021, CONSULTATION DATE:  12/14 REFERRING MD:  Wilford Corner, CHIEF COMPLAINT:  Stroke, vent management   History of Present Illness:  Richard Oneal is a 50 y.o. M with PMH of HL who presented to [facility] ED for headache.  He was noted to have slurred speech and code stroke was activated.  Stroke work-up was significant for bilateral vertebral artery and basilar artery occlusion and pt was transferred to [facility] for revascularization.  LKW was the night before presentation, so was outside the window for TPA.     On arrival to [facility] was taken to IR, however were not able to gain access to the true lumen of the L vertebral artery and developed collaterals in the bilateral PCA's suggested possible chronic occlusion, therefore the procedure was aborted.  Pt left intubated and MRI ordered. PCCM consulted for ventilator management.   Pertinent  Medical History   has a past medical history of Dental caries and Hypercholesterolemia.  Significant Hospital Events: Including procedures, antibiotic start and stop dates in addition to other pertinent events   12/13 Transferred from [place] to [facility], thrombectomy unsuccessful, left intubated pending MRI and PCCM consult for vent management, on Cleviprex 12/14 Attempted SBT, however, patient too sedated/asleep to do so  12/15 progression of extensive infarct in cerebellum, pons, acute infarct in occipital periventricular white matter and splenium of corpus callosum and new mild hydrocephalus - NSG consulted, any surgery will not change prognosis  12/15 overnight had drop in BP, started on levo  Interim History / Subjective:  Patient sedated, not following commands, not responding to painful stimuli. OG tube to suction, with coffee ground emesis output.   Objective   Blood pressure 99/62, pulse 81, temperature 97.9 F (36.6 C), temperature source Temporal, resp.  rate 16, height 5\' 6"  (1.676 m), weight 80 kg, SpO2 100 %.    Vent Mode: PRVC FiO2 (%):  [40 %] 40 % Set Rate:  [16 bmp] 16 bmp Vt Set:  [550 mL] 550 mL PEEP:  [5 cmH20] 5 cmH20 Pressure Support:  [5 cmH20] 5 cmH20 Plateau Pressure:  [15 cmH20-17 cmH20] 15 cmH20   Intake/Output Summary (Last 24 hours) at 02/26/2021 0651 Last data filed at 02/15/2021 0400 Gross per 24 hour  Intake 1035.33 ml  Output 450 ml  Net 585.33 ml   Filed Weights   02/12/2021 1328  Weight: 80 kg    Examination: General: Remains intubated, sedated. NAD HENT: [place], AT. ETT in place. OG tube to suction with coffee ground emesis otuput Lungs: CTAB on vent.  Cardiovascular: RRR, no murmurs Abdomen: soft, nontender, +BS Extremities: No LE edema Neuro: Sedated, unable to follow commands. Pupils unequal, nonreactive. 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 New mild hydrocephalus on MRI, as well. Neurosurg consulted for possible EVD vs suboccipital craniectomy, however, NSG feels procedure will not change prognosis/outcome. Poor prognosis discussed with family at length. Code status was changed to DNR and likely will transition to comfort care pending family arrival to hospital.  - Neuro consulted, appreciate recs -  SBP goal 130-180 - Comfort care measures pending family arrival   Acute respiratory failure 2/2 above Vent settings: PRVC, RR 16, Tv 550, PEEP 5 and FiO2 40%. Peak pressure 25 and plateau pressure 15 with these settings.  - Not tolerating SBTs given progression of  strokes  Hypotension Patient had drop in BP overnight. Precedex and fentanyl gtt held. Patient given 1.5L IVF bolus and norepi gtt started - Wean norepi gtt for possible organ donation   Probable stress ulcer Nausea  Vomiting Likely secondary to extension of cerebellar/brainstem stroke. Patient also had coffee ground emesis likely secondary to stress ulcer. - OG tube wall suctioning - PRN reglan, zofran - PPI    Best Practice (right click and "Reselect all SmartList Selections" daily)   Diet/type: NPO DVT prophylaxis: SCD GI prophylaxis: PPI Lines: N/A Foley:  N/A Code Status:  full code Last date of multidisciplinary goals of care discussion [family updated 12/16]  Labs   CBC: Recent Labs  Lab 02-28-2021 1345 02-28-2021 1347 02/09/21 0629 02/10/21 0248 02/10/21 1314  WBC 10.8*  --  13.6* 17.1* 17.4*  NEUTROABS  --   --  10.5* 13.7*  --   HGB 15.8 15.0 14.8 15.3 14.4  HCT 45.6 44.0 42.8 44.8 40.0  MCV 88.9  --  90.1 93.3 89.1  PLT 246  --  247 173 212    Basic Metabolic Panel: Recent Labs  Lab 02-28-21 1345 02/28/2021 1347 02/09/21 0629 02/10/21 0248 02/10/21 0644  NA 139 139 134* 138  --   K 3.5 3.4* 3.3* 4.1  --   CL 106  --  102 107  --   CO2 23  --  22 19*  --   GLUCOSE 130*  --  116* 131*  --   BUN 9  --  17 21*  --   CREATININE 0.74  --  1.03 1.01  --   CALCIUM 9.1  --  9.1 8.9  --   MG  --   --  2.0  --  2.2   GFR: Estimated Creatinine Clearance: 87 mL/min (by C-G formula based on SCr of 1.01 mg/dL). Recent Labs  Lab 02/28/21 1345 02/09/21 0629 02/10/21 0248 02/10/21 1314  WBC 10.8* 13.6* 17.1* 17.4*    Liver Function Tests: Recent Labs  Lab 2021/02/28 1345 02/09/21 0629 02/10/21 0248  AST 17 14* 29  ALT 18 14 QUANTITY NOT SUFFICIENT, UNABLE TO PERFORM TEST  ALKPHOS 55 48 47  BILITOT 0.9 0.7 QUANTITY NOT SUFFICIENT, UNABLE TO PERFORM TEST  PROT 7.5 6.7 6.4*  ALBUMIN 4.6 4.2 3.8   No results for input(s): LIPASE, AMYLASE in the last  168 hours. No results for input(s): AMMONIA in the last 168 hours.  ABG    Component Value Date/Time   PHART 7.373 2021-02-28 1347   PCO2ART 42.8 28-Feb-2021 1347   PO2ART 131 (H) 2021-02-28 1347   HCO3 24.9 02-28-2021 1347   TCO2 26 02-28-21 1347   ACIDBASEDEF 1.0 2021/02/28 1347   O2SAT 99.0 02/28/21 1347     Coagulation Profile: No results for input(s): INR, PROTIME in the last 168 hours.  Cardiac Enzymes: No results for input(s): CKTOTAL, CKMB, CKMBINDEX, TROPONINI in the last 168 hours.  HbA1C: Hgb A1c MFr Bld  Date/Time Value Ref Range Status  02/09/2021 06:29 AM 4.9 4.8 - 5.6 % Final    Comment:    (NOTE) Pre diabetes:          5.7%-6.4%  Diabetes:              >  6.4%  Glycemic control for   <7.0% adults with diabetes     CBG: Recent Labs  Lab 02/10/21 1140 02/10/21 1605 02/10/21 1953 02/10/21 2340 02/10/2021 0343  GLUCAP 131* 106* 114* 122* 103*    Review of Systems:     Critical care time: 40 minutes

## 2021-02-11 NOTE — Progress Notes (Signed)
Clinical Update:  Discussed patient case with medical examiner. Patient is not an appropriate ME case. Can proceed with organ donation.   Eliezer Bottom, MD Internal Medicine, PGY-3 02-27-21 3:52 PM Pager # [phone]

## 2021-02-11 NOTE — Procedures (Signed)
Bronchoscopy Procedure Note  Richard Oneal  [id number]  Feb 04, 1971  Date:02/02/2021  Time:6:35 PM   Provider Performing:Arlicia Paquette   Procedure(s):  Flexible bronchoscopy with bronchial alveolar lavage (62947)  Indication(s) Acute hypoxic respiratory failure Organ donation   Consent Risks of the procedure as well as the alternatives and risks of each were explained to the patient and/or caregiver.  Consent for the procedure was obtained and is signed in the bedside chart  Anesthesia None   Time Out Verified patient identification, verified procedure, site/side was marked, verified correct patient position, special equipment/implants available, medications/allergies/relevant history reviewed, required imaging and test results available.   Sterile Technique Usual hand hygiene, masks, gowns, and gloves were used   Procedure Description Bronchoscope advanced through endotracheal tube and into airway.  Airways were examined down to subsegmental level with findings noted below.   Following diagnostic evaluation, BAL(s) performed in RUL and LUL with normal saline and return of 25cc mucopurulent fluid  Findings: see picture below     Complications/Tolerance None; patient tolerated the procedure well. Chest X-ray is needed post procedure.   EBL Minimal   Specimen(s) BAL sent

## 2021-02-12 ENCOUNTER — Inpatient Hospital Stay (HOSPITAL_COMMUNITY): Payer: Medicaid Other

## 2021-02-12 DIAGNOSIS — Z529 Donor of unspecified organ or tissue: Secondary | ICD-10-CM

## 2021-02-12 LAB — CBC
HCT: 35.3 % — ABNORMAL LOW (ref 39.0–52.0)
HCT: 30.8 % — ABNORMAL LOW (ref 39.0–52.0)
HCT: 28 % — ABNORMAL LOW (ref 39.0–52.0)
HCT: 25.3 % — ABNORMAL LOW (ref 39.0–52.0)
Hemoglobin: 11.9 g/dL — ABNORMAL LOW (ref 13.0–17.0)
Hemoglobin: 10.7 g/dL — ABNORMAL LOW (ref 13.0–17.0)
Hemoglobin: 9.7 g/dL — ABNORMAL LOW (ref 13.0–17.0)
Hemoglobin: 8.5 g/dL — ABNORMAL LOW (ref 13.0–17.0)
MCH: 30.8 pg (ref 26.0–34.0)
MCH: 31.7 pg (ref 26.0–34.0)
MCH: 31.9 pg (ref 26.0–34.0)
MCH: 31.1 pg (ref 26.0–34.0)
MCHC: 33.7 g/dL (ref 30.0–36.0)
MCHC: 34.7 g/dL (ref 30.0–36.0)
MCHC: 34.6 g/dL (ref 30.0–36.0)
MCHC: 33.6 g/dL (ref 30.0–36.0)
MCV: 91.5 fL (ref 80.0–100.0)
MCV: 91.1 fL (ref 80.0–100.0)
MCV: 92.1 fL (ref 80.0–100.0)
MCV: 92.7 fL (ref 80.0–100.0)
Platelets: 168 10*3/uL (ref 150–400)
Platelets: 164 10*3/uL (ref 150–400)
Platelets: 168 10*3/uL (ref 150–400)
Platelets: 166 10*3/uL (ref 150–400)
RBC: 3.86 MIL/uL — ABNORMAL LOW (ref 4.22–5.81)
RBC: 3.38 MIL/uL — ABNORMAL LOW (ref 4.22–5.81)
RBC: 3.04 MIL/uL — ABNORMAL LOW (ref 4.22–5.81)
RBC: 2.73 MIL/uL — ABNORMAL LOW (ref 4.22–5.81)
RDW: 13 % (ref 11.5–15.5)
RDW: 13.1 % (ref 11.5–15.5)
RDW: 13.2 % (ref 11.5–15.5)
RDW: 13.3 % (ref 11.5–15.5)
WBC: 8.6 10*3/uL (ref 4.0–10.5)
WBC: 9.2 10*3/uL (ref 4.0–10.5)
WBC: 7.5 10*3/uL (ref 4.0–10.5)
WBC: 7.4 10*3/uL (ref 4.0–10.5)
nRBC: 0 % (ref 0.0–0.2)
nRBC: 0 % (ref 0.0–0.2)
nRBC: 0 % (ref 0.0–0.2)
nRBC: 0 % (ref 0.0–0.2)

## 2021-02-12 LAB — COMPREHENSIVE METABOLIC PANEL
ALT: 11 U/L (ref 0–44)
ALT: 12 U/L (ref 0–44)
ALT: 11 U/L (ref 0–44)
ALT: 11 U/L (ref 0–44)
AST: 21 U/L (ref 15–41)
AST: 20 U/L (ref 15–41)
AST: 20 U/L (ref 15–41)
AST: 16 U/L (ref 15–41)
Albumin: 3.2 g/dL — ABNORMAL LOW (ref 3.5–5.0)
Albumin: 2.8 g/dL — ABNORMAL LOW (ref 3.5–5.0)
Albumin: 2.7 g/dL — ABNORMAL LOW (ref 3.5–5.0)
Albumin: 2.8 g/dL — ABNORMAL LOW (ref 3.5–5.0)
Alkaline Phosphatase: 41 U/L (ref 38–126)
Alkaline Phosphatase: 36 U/L — ABNORMAL LOW (ref 38–126)
Alkaline Phosphatase: 32 U/L — ABNORMAL LOW (ref 38–126)
Alkaline Phosphatase: 32 U/L — ABNORMAL LOW (ref 38–126)
Anion gap: 9 (ref 5–15)
Anion gap: 9 (ref 5–15)
Anion gap: 8 (ref 5–15)
Anion gap: 7 (ref 5–15)
BUN: 33 mg/dL — ABNORMAL HIGH (ref 6–20)
BUN: 30 mg/dL — ABNORMAL HIGH (ref 6–20)
BUN: 33 mg/dL — ABNORMAL HIGH (ref 6–20)
BUN: 32 mg/dL — ABNORMAL HIGH (ref 6–20)
CO2: 23 mmol/L (ref 22–32)
CO2: 25 mmol/L (ref 22–32)
CO2: 24 mmol/L (ref 22–32)
CO2: 23 mmol/L (ref 22–32)
Calcium: 8.8 mg/dL — ABNORMAL LOW (ref 8.9–10.3)
Calcium: 9.1 mg/dL (ref 8.9–10.3)
Calcium: 9 mg/dL (ref 8.9–10.3)
Calcium: 9 mg/dL (ref 8.9–10.3)
Chloride: 115 mmol/L — ABNORMAL HIGH (ref 98–111)
Chloride: 121 mmol/L — ABNORMAL HIGH (ref 98–111)
Chloride: 126 mmol/L — ABNORMAL HIGH (ref 98–111)
Chloride: 126 mmol/L — ABNORMAL HIGH (ref 98–111)
Creatinine, Ser: 1.3 mg/dL — ABNORMAL HIGH (ref 0.61–1.24)
Creatinine, Ser: 1.46 mg/dL — ABNORMAL HIGH (ref 0.61–1.24)
Creatinine, Ser: 1.77 mg/dL — ABNORMAL HIGH (ref 0.61–1.24)
Creatinine, Ser: 1.45 mg/dL — ABNORMAL HIGH (ref 0.61–1.24)
GFR, Estimated: >60 mL/min (ref >60)
GFR, Estimated: 58 mL/min — ABNORMAL LOW (ref >60)
GFR, Estimated: 46 mL/min — ABNORMAL LOW (ref >60)
GFR, Estimated: 59 mL/min — ABNORMAL LOW (ref >60)
Glucose, Bld: 158 mg/dL — ABNORMAL HIGH (ref 70–99)
Glucose, Bld: 173 mg/dL — ABNORMAL HIGH (ref 70–99)
Glucose, Bld: 172 mg/dL — ABNORMAL HIGH (ref 70–99)
Glucose, Bld: 169 mg/dL — ABNORMAL HIGH (ref 70–99)
Potassium: 3.8 mmol/L (ref 3.5–5.1)
Potassium: 3.9 mmol/L (ref 3.5–5.1)
Potassium: 3.6 mmol/L (ref 3.5–5.1)
Potassium: 3 mmol/L — ABNORMAL LOW (ref 3.5–5.1)
Sodium: 147 mmol/L — ABNORMAL HIGH (ref 135–145)
Sodium: 155 mmol/L — ABNORMAL HIGH (ref 135–145)
Sodium: 158 mmol/L — ABNORMAL HIGH (ref 135–145)
Sodium: 156 mmol/L — ABNORMAL HIGH (ref 135–145)
Total Bilirubin: 1 mg/dL (ref 0.3–1.2)
Total Bilirubin: 0.6 mg/dL (ref 0.3–1.2)
Total Bilirubin: 0.9 mg/dL (ref 0.3–1.2)
Total Bilirubin: 1 mg/dL (ref 0.3–1.2)
Total Protein: 6.7 g/dL (ref 6.5–8.1)
Total Protein: 6.2 g/dL — ABNORMAL LOW (ref 6.5–8.1)
Total Protein: 6 g/dL — ABNORMAL LOW (ref 6.5–8.1)
Total Protein: 5.8 g/dL — ABNORMAL LOW (ref 6.5–8.1)

## 2021-02-12 LAB — POCT I-STAT 7, (LYTES, BLD GAS, ICA,H+H)
Acid-Base Excess: 0 mmol/L (ref 0.0–2.0)
Bicarbonate: 24.7 mmol/L (ref 20.0–28.0)
Calcium, Ion: 1.3 mmol/L (ref 1.15–1.40)
HCT: 29 % — ABNORMAL LOW (ref 39.0–52.0)
Hemoglobin: 9.9 g/dL — ABNORMAL LOW (ref 13.0–17.0)
O2 Saturation: 99 %
Patient temperature: 97.5
Potassium: 3 mmol/L — ABNORMAL LOW (ref 3.5–5.1)
Sodium: 158 mmol/L — ABNORMAL HIGH (ref 135–145)
TCO2: 26 mmol/L (ref 22–32)
pCO2 arterial: 37.9 mmHg (ref 32.0–48.0)
pH, Arterial: 7.419 (ref 7.350–7.450)
pO2, Arterial: 161 mmHg — ABNORMAL HIGH (ref 83.0–108.0)

## 2021-02-12 LAB — LACTIC ACID, PLASMA: Lactic Acid, Venous: 1.3 mmol/L (ref 0.5–1.9)

## 2021-02-12 LAB — GLUCOSE, CAPILLARY
Glucose-Capillary: 116 mg/dL — ABNORMAL HIGH (ref 70–99)
Glucose-Capillary: 151 mg/dL — ABNORMAL HIGH (ref 70–99)

## 2021-02-12 LAB — ECHOCARDIOGRAM COMPLETE
Area-P 1/2: 5.75 cm2
Height: 66 in
S' Lateral: 2.4 cm
Weight: 2529.12 oz

## 2021-02-12 LAB — RETICULOCYTES
Immature Retic Fract: 6.1 % (ref 2.3–15.9)
RBC.: 2.71 MIL/uL — ABNORMAL LOW (ref 4.22–5.81)
Retic Count, Absolute: 28.5 10*3/uL (ref 19.0–186.0)
Retic Ct Pct: 1.1 % (ref 0.4–3.1)

## 2021-02-12 LAB — IRON AND TIBC
Iron: 15 ug/dL — ABNORMAL LOW (ref 45–182)
Saturation Ratios: 7 % — ABNORMAL LOW (ref 17.9–39.5)
TIBC: 202 ug/dL — ABNORMAL LOW (ref 250–450)
UIBC: 187 ug/dL

## 2021-02-12 LAB — PROTIME-INR
INR: 1.5 — ABNORMAL HIGH (ref 0.8–1.2)
Prothrombin Time: 17.9 seconds — ABNORMAL HIGH (ref 11.4–15.2)

## 2021-02-12 LAB — FERRITIN: Ferritin: 741 ng/mL — ABNORMAL HIGH (ref 24–336)

## 2021-02-12 LAB — BILIRUBIN, DIRECT: Bilirubin, Direct: 0.2 mg/dL (ref 0.0–0.2)

## 2021-02-12 LAB — FOLATE: Folate: 4.6 ng/mL — ABNORMAL LOW (ref >5.9)

## 2021-02-12 LAB — VITAMIN B12: Vitamin B-12: 1179 pg/mL — ABNORMAL HIGH (ref 180–914)

## 2021-02-12 LAB — APTT: aPTT: 37 seconds — ABNORMAL HIGH (ref 24–36)

## 2021-02-12 IMAGING — DX DG CHEST 1V PORT
1 series · 1 of 1 positions shown · non-contrast
Comparison: [DATE]

CLINICAL DATA: Upcoming organ procurement

EXAM:
PORTABLE CHEST 1 VIEW

[chest]
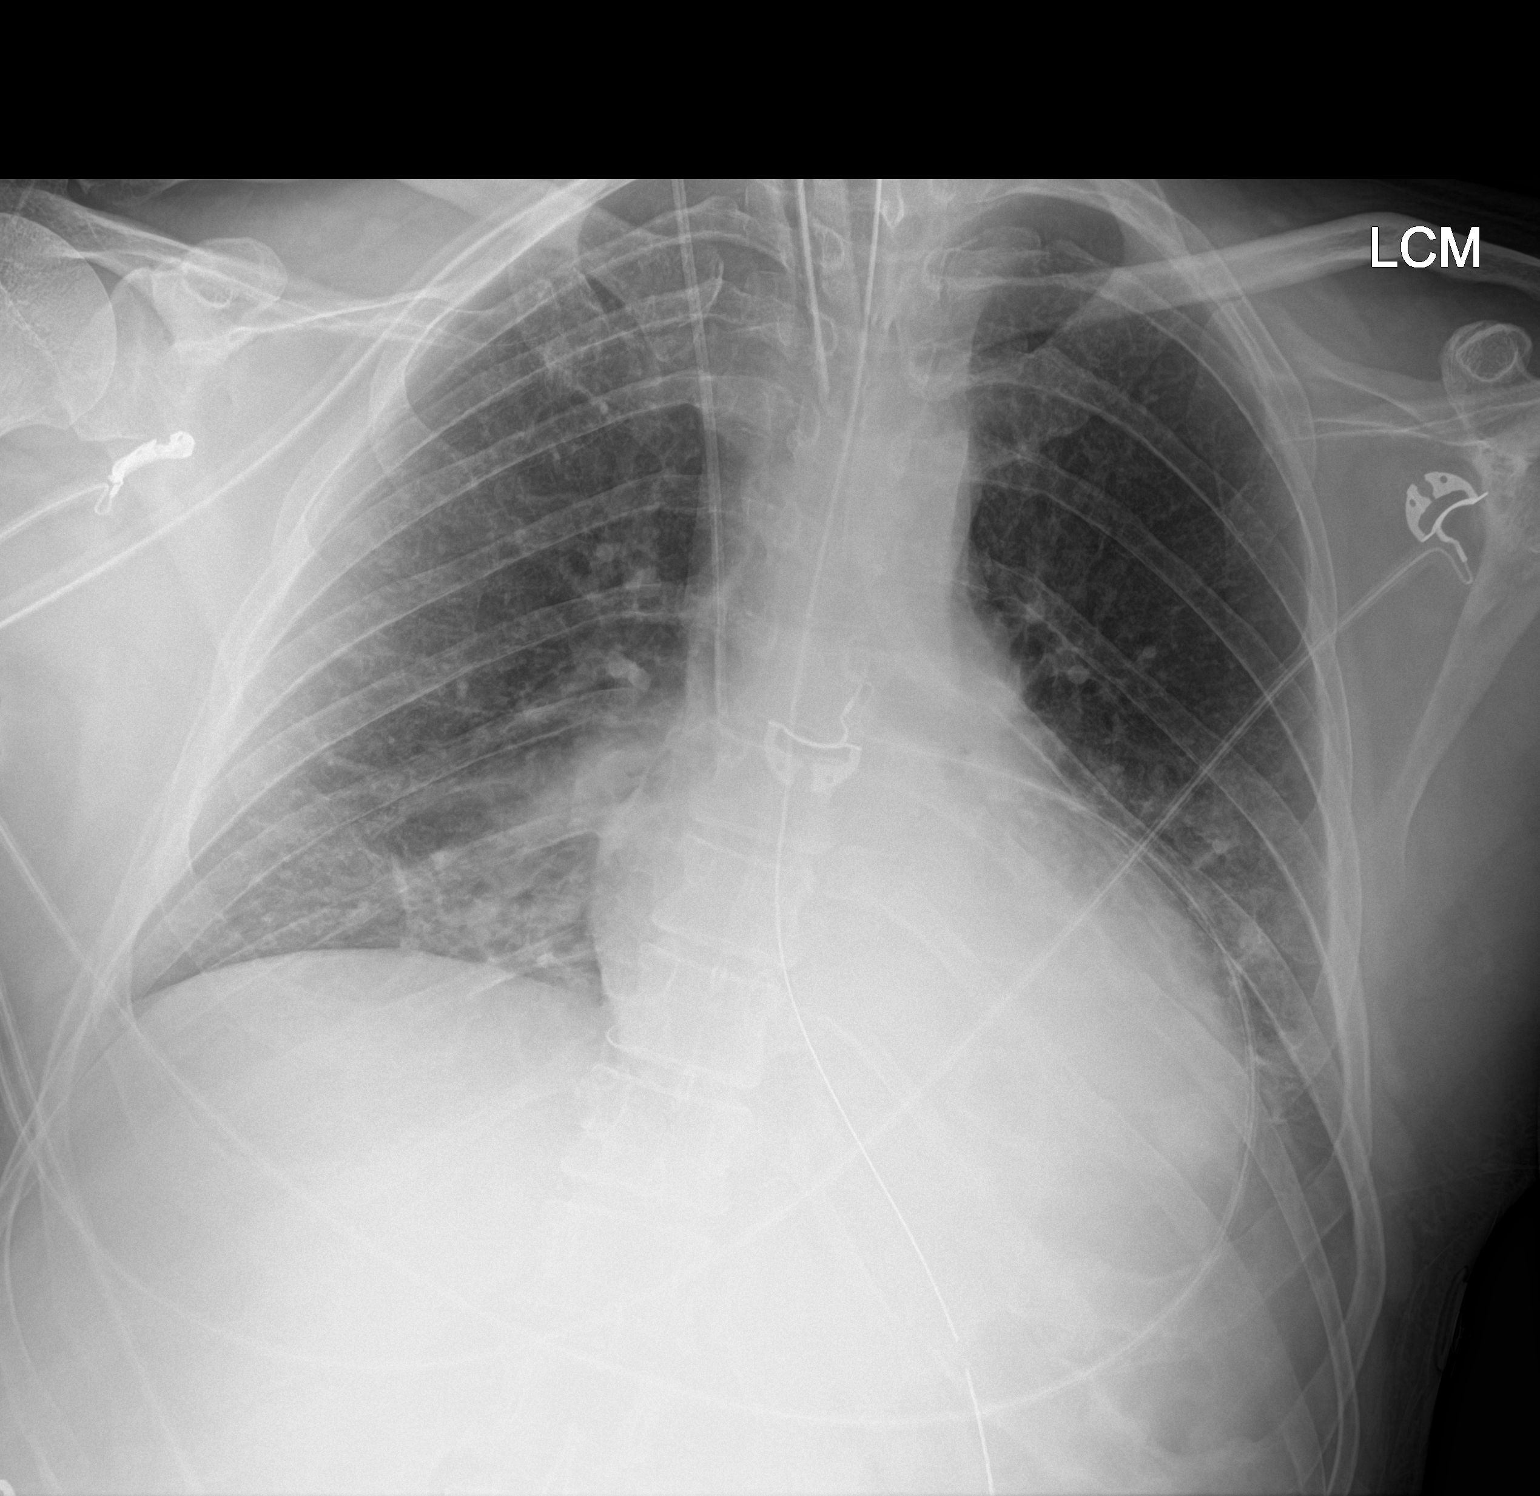

[1 of 1 positions shown; findings below may reference images not displayed]

FINDINGS: Endotracheal tube, gastric catheter and right jugular central line
are again seen and stable. Left lower lobe consolidation is noted
particularly in the retrocardiac region slightly improved when
compared with the prior exam. The consolidation in the right lower
lobe has improved as well. No pneumothorax or is seen. No acute bony
abnormality is noted.
IMPRESSION: Persistent left basilar consolidation in the retrocardiac region
although overall aeration is improved.

Tubes and lines in satisfactory position.

## 2021-02-12 MED ORDER — LABETALOL HCL 5 MG/ML IV SOLN
INTRAVENOUS | Status: AC
  Administered 2021-02-12: 10 mg via INTRAVENOUS
  Filled 2021-02-12: qty 4

## 2021-02-12 MED ORDER — ALBUMIN HUMAN 25 % IV SOLN
25.0000 g | Freq: Once | INTRAVENOUS | Status: AC
  Administered 2021-02-12: 25 g via INTRAVENOUS

## 2021-02-12 MED ORDER — NOREPINEPHRINE 4 MG/250ML-% IV SOLN
0.0000 ug/min | INTRAVENOUS | Status: DC
  Filled 2021-02-12: qty 250

## 2021-02-12 MED ORDER — SODIUM CHLORIDE 0.9% FLUSH: 10.0000 mL | INTRAVENOUS | Status: DC | PRN

## 2021-02-12 MED ORDER — LABETALOL HCL 5 MG/ML IV SOLN: 10.0000 mg | Freq: Once | INTRAVENOUS | Status: AC

## 2021-02-12 MED ORDER — SODIUM CHLORIDE 0.9 % IV SOLN
2.0000 g | Freq: Two times a day (BID) | INTRAVENOUS | Status: DC
  Administered 2021-02-12 – 2021-02-13 (×2): 2 g via INTRAVENOUS
  Filled 2021-02-12 (×2): qty 2

## 2021-02-12 MED ORDER — LACTATED RINGERS IV BOLUS
1000.0000 mL | Freq: Once | INTRAVENOUS | Status: AC
  Administered 2021-02-12: 1000 mL via INTRAVENOUS

## 2021-02-12 MED ORDER — NICARDIPINE HCL IN NACL 20-0.86 MG/200ML-% IV SOLN
3.0000 mg/h | INTRAVENOUS | Status: DC
  Administered 2021-02-12: 10 mg/h via INTRAVENOUS
  Administered 2021-02-12: 5 mg/h via INTRAVENOUS
  Filled 2021-02-12 (×3): qty 200

## 2021-02-12 MED ORDER — FUROSEMIDE 10 MG/ML IJ SOLN
INTRAMUSCULAR | Status: AC
  Filled 2021-02-12: qty 2

## 2021-02-12 MED ORDER — DESMOPRESSIN ACETATE 4 MCG/ML IJ SOLN
1.0000 ug | Freq: Once | INTRAMUSCULAR | Status: AC
  Administered 2021-02-13: 07:00:00 1 ug via INTRAVENOUS
  Filled 2021-02-12: qty 1

## 2021-02-12 MED ORDER — FUROSEMIDE 10 MG/ML IJ SOLN
20.0000 mg | Freq: Once | INTRAMUSCULAR | Status: AC
  Administered 2021-02-12: 20 mg via INTRAVENOUS

## 2021-02-12 MED ORDER — FREE WATER
200.0000 mL | Freq: Four times a day (QID) | Status: DC
  Administered 2021-02-12 – 2021-02-13 (×3): 200 mL

## 2021-02-12 MED ORDER — ALBUMIN HUMAN 25 % IV SOLN
12.5000 g | Freq: Once | INTRAVENOUS | Status: DC
  Filled 2021-02-12: qty 50

## 2021-02-12 MED ORDER — SODIUM CHLORIDE 0.9% FLUSH
10.0000 mL | Freq: Two times a day (BID) | INTRAVENOUS | Status: DC
  Administered 2021-02-12 (×2): 10 mL

## 2021-02-12 NOTE — Progress Notes (Signed)
Pharmacy Antibiotic Note  Richard Oneal is a 50 y.o. male admitted on Mar 01, 2021 with slurred speech and headache, found to have extensive progression of prior strokes.  He is being evaluated for organ donation.  Ct showed possible bladder obstruction, UTI and multifocal PNA.  Tracheal aspirate culture growing Klebsiella, preliminary result.  Pharmacy has been consulted for cefepime dosing.  Renal function worsening, Tmax 100.4, WBC WNL.  Plan: Cefepime 2gm IV Q12H Monitor renal fxn, micro data to de-escalate  Height: 5\' 6"  (167.6 cm) Weight: 71.7 kg (158 lb 1.1 oz) IBW/kg (Calculated) : 63.8  Temp (24hrs), Avg:98.1 F (36.7 C), Min:94.3 F (34.6 C), Max:100.6 F (38.1 C)  Recent Labs  Lab 02/10/21 0248 02/10/21 1314 02/03/2021 1636 01/29/2021 2214 02/12/21 0414 02/12/21 1007  WBC 17.1* 17.4* 8.2 4.0 8.6 9.2  CREATININE 1.01  --  1.24 1.60* 1.30* 1.46*    Estimated Creatinine Clearance: 54.6 mL/min (A) (by C-G formula based on SCr of 1.46 mg/dL (H)).    No Known Allergies  Cefepime 12/17 >>   12/13 MRSA PCR - negative 12/15 BCx - NGTD 12/16 TA - Kleb pneumo (preliminary) 12/17 UCx   Richard Oneal D. 1/18, PharmD, BCPS, BCCCP 02/12/2021, 2:28 PM

## 2021-02-12 NOTE — Plan of Care (Signed)
°  Problem: Education: Goal: Knowledge of General Education information will improve Description: Including pain rating scale, medication(s)/side effects and non-pharmacologic comfort measures Outcome: Not Progressing   Problem: Health Behavior/Discharge Planning: Goal: Ability to manage health-related needs will improve Outcome: Not Progressing   Problem: Clinical Measurements: Goal: Ability to maintain clinical measurements within normal limits will improve Outcome: Not Progressing Goal: Will remain free from infection Outcome: Not Progressing Goal: Diagnostic test results will improve Outcome: Not Progressing Goal: Respiratory complications will improve Outcome: Not Progressing Goal: Cardiovascular complication will be avoided Outcome: Not Progressing   Problem: Activity: Goal: Risk for activity intolerance will decrease Outcome: Not Progressing   Problem: Nutrition: Goal: Adequate nutrition will be maintained Outcome: Not Progressing   Problem: Coping: Goal: Level of anxiety will decrease Outcome: Not Progressing   Problem: Elimination: Goal: Will not experience complications related to bowel motility Outcome: Not Progressing Goal: Will not experience complications related to urinary retention Outcome: Not Progressing   Problem: Pain Managment: Goal: General experience of comfort will improve Outcome: Not Progressing   Problem: Safety: Goal: Ability to remain free from injury will improve Outcome: Not Progressing   Problem: Skin Integrity: Goal: Risk for impaired skin integrity will decrease Outcome: Not Progressing   Problem: Education: Goal: Knowledge of secondary prevention will improve (SELECT ALL) Outcome: Not Progressing   Problem: Nutrition: Goal: Risk of aspiration will decrease Outcome: Not Progressing

## 2021-02-12 NOTE — Progress Notes (Signed)
°  Echocardiogram 2D Echocardiogram has been performed.  Delcie Roch 02/12/2021, 1:24 PM

## 2021-02-12 NOTE — Progress Notes (Signed)
NAME:  Richard Oneal, MRN:  [id number], DOB:  1970-06-10, LOS: 4 ADMISSION DATE:  01/29/2021, CONSULTATION DATE:  12/14 REFERRING MD:  Wilford Corner, CHIEF COMPLAINT:  Stroke, vent management   History of Present Illness:  Richard Oneal is a 50 y.o. M with PMH of HL who presented to [facility] ED for headache.  He was noted to have slurred speech and code stroke was activated.  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  LKW was the night before presentation, so was outside the window for TPA.     On arrival to [facility] was taken to IR, however were not able to gain access to the true lumen of the L vertebral artery and developed collaterals in the bilateral PCA's suggested possible chronic occlusion, therefore the procedure was aborted.  Pt left intubated and MRI ordered. PCCM consulted for ventilator management.   Pertinent  Medical History   has a past medical history of Dental caries and Hypercholesterolemia.  Significant Hospital Events: Including procedures, antibiotic start and stop dates in addition to other pertinent events   12/13 Transferred from [place] to [facility], thrombectomy unsuccessful, left intubated pending MRI and PCCM consult for vent management, on Cleviprex 12/14 Attempted SBT, however, patient too sedated/asleep to do so  12/15 progression of extensive infarct in cerebellum, pons, acute infarct in occipital periventricular white matter and splenium of corpus callosum and new mild hydrocephalus - NSG consulted, any surgery will not change prognosis  12/15 overnight had drop in BP, started on levo 12/16 Weaned off levophed.  12/17 On nicardipine overnight for HTN  Interim History / Subjective:  Weaned off levophed yesterday. On nicardipine overnight for HTN Obtained testing and imaging per [facility]  Objective   Blood pressure 99/61, pulse (!) 108, temperature  100 F (37.8 C), resp. rate 16, height 5\' 6"  (1.676 m), weight 71.7 kg, SpO2 95 %. CVP:  [4 mmHg-16 mmHg] 5 mmHg  Vent Mode: PRVC FiO2 (%):  [40 %-100 %] 70 % Set Rate:  [16 bmp] 16 bmp Vt Set:  [550 mL] 550 mL PEEP:  [5 cmH20] 5 cmH20 Plateau Pressure:  [15 cmH20-26 cmH20] 18 cmH20   Intake/Output Summary (Last 24 hours) at 02/12/2021 0954 Last data filed at 02/12/2021 0900 Gross per 24 hour  Intake 2039.58 ml  Output 3525 ml  Net -1485.42 ml   Filed Weights   02/09/2021 1328 02/12/21 0545  Weight: 80 kg 71.7 kg   Physical Exam: General: Critically ill-appearing, unresponsive HENT: [place], AT, OP clear, MMM Eyes: EOMI, no scleral icterus Respiratory: Clear to auscultation bilaterally.  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 New mild hydrocephalus on MRI, as well. Neurosurg consulted for possible EVD vs suboccipital craniectomy, however, NSG feels procedure will not change prognosis/outcome. Poor prognosis discussed with family at length. Code status was changed to DNR and likely will transition to comfort care pending family arrival to hospital.  - Neuro  consulted, appreciate recs - SBP goal 130-180 - Comfort care measures pending family arrival   Acute respiratory failure 2/2 above -Full vent support. No plan for extubation  -S/p bronchoscopy 02/10/2021 for donor evaluation:  Mucopurulent secretions present -F/u respiratory cultures  Hypotension - resolved Hypertensive urgency - Nicardipine for goal SBP <180 - Off pressors  Probable stress ulcer Nausea  Vomiting Likely secondary to extension of cerebellar/brainstem stroke. Patient also had coffee ground emesis likely secondary to stress ulcer. - OG tube wall suctioning - PRN reglan, zofran - PPI   GOC: Currently being evaluated for organ donation. Labs pending. Coordinating care with [employer]. Will change code status to full code per [facility] protocol to maintain organ patency. Family aware of code status change and understand how this affects clinical management of patient.  Best Practice (right click and "Reselect all SmartList Selections" daily)   Diet/type: NPO DVT prophylaxis: SCD GI prophylaxis: PPI Lines: N/A Foley:  N/A Code Status:  full code Last date of multidisciplinary goals of care discussion [family updated 12/17]  Critical care time: 45 minutes    The patient is critically ill with multiple organ systems failure and requires high complexity decision making for assessment and support, frequent evaluation and titration of therapies, application of advanced monitoring technologies and extensive interpretation of multiple databases.   Mechele Collin, M.D. [facility] Pulmonary/Critical Care Medicine 02/12/2021 9:55 AM   Please see Amion for pager number to reach on-call Pulmonary and Critical Care Team.

## 2021-02-13 ENCOUNTER — Encounter (HOSPITAL_COMMUNITY): Admission: EM | Disposition: E | Payer: Self-pay | Source: Home / Self Care | Attending: Neurology

## 2021-02-13 HISTORY — PX: RIGHT/LEFT HEART CATH AND CORONARY ANGIOGRAPHY: CATH118266

## 2021-02-13 LAB — POCT I-STAT 7, (LYTES, BLD GAS, ICA,H+H)
Acid-Base Excess: 0 mmol/L (ref 0.0–2.0)
Acid-Base Excess: 0 mmol/L (ref 0.0–2.0)
Acid-base deficit: 2 mmol/L (ref 0.0–2.0)
Bicarbonate: 24.3 mmol/L (ref 20.0–28.0)
Bicarbonate: 24.6 mmol/L (ref 20.0–28.0)
Bicarbonate: 22.5 mmol/L (ref 20.0–28.0)
Calcium, Ion: 1.38 mmol/L (ref 1.15–1.40)
Calcium, Ion: 1.41 mmol/L — ABNORMAL HIGH (ref 1.15–1.40)
Calcium, Ion: 1.31 mmol/L (ref 1.15–1.40)
HCT: 23 % — ABNORMAL LOW (ref 39.0–52.0)
HCT: 34 % — ABNORMAL LOW (ref 39.0–52.0)
HCT: 20 % — ABNORMAL LOW (ref 39.0–52.0)
Hemoglobin: 7.8 g/dL — ABNORMAL LOW (ref 13.0–17.0)
Hemoglobin: 11.6 g/dL — ABNORMAL LOW (ref 13.0–17.0)
Hemoglobin: 6.8 g/dL — CL (ref 13.0–17.0)
O2 Saturation: 100 %
O2 Saturation: 100 %
O2 Saturation: 95 %
Patient temperature: 97.5
Patient temperature: 98.8
Potassium: 2.9 mmol/L — ABNORMAL LOW (ref 3.5–5.1)
Potassium: 3.1 mmol/L — ABNORMAL LOW (ref 3.5–5.1)
Potassium: 3.9 mmol/L (ref 3.5–5.1)
Sodium: 163 mmol/L (ref 135–145)
Sodium: 167 mmol/L (ref 135–145)
Sodium: 166 mmol/L (ref 135–145)
TCO2: 25 mmol/L (ref 22–32)
TCO2: 26 mmol/L (ref 22–32)
TCO2: 24 mmol/L (ref 22–32)
pCO2 arterial: 35.6 mmHg (ref 32.0–48.0)
pCO2 arterial: 40.2 mmHg (ref 32.0–48.0)
pCO2 arterial: 37.8 mmHg (ref 32.0–48.0)
pH, Arterial: 7.44 (ref 7.350–7.450)
pH, Arterial: 7.394 (ref 7.350–7.450)
pH, Arterial: 7.383 (ref 7.350–7.450)
pO2, Arterial: 176 mmHg — ABNORMAL HIGH (ref 83.0–108.0)
pO2, Arterial: 191 mmHg — ABNORMAL HIGH (ref 83.0–108.0)
pO2, Arterial: 78 mmHg — ABNORMAL LOW (ref 83.0–108.0)

## 2021-02-13 LAB — CBC
HCT: 25.9 % — ABNORMAL LOW (ref 39.0–52.0)
HCT: 25.5 % — ABNORMAL LOW (ref 39.0–52.0)
Hemoglobin: 8.6 g/dL — ABNORMAL LOW (ref 13.0–17.0)
Hemoglobin: 8.5 g/dL — ABNORMAL LOW (ref 13.0–17.0)
MCH: 31.5 pg (ref 26.0–34.0)
MCH: 31.4 pg (ref 26.0–34.0)
MCHC: 33.2 g/dL (ref 30.0–36.0)
MCHC: 33.3 g/dL (ref 30.0–36.0)
MCV: 94.9 fL (ref 80.0–100.0)
MCV: 94.1 fL (ref 80.0–100.0)
Platelets: 186 10*3/uL (ref 150–400)
Platelets: 206 10*3/uL (ref 150–400)
RBC: 2.73 MIL/uL — ABNORMAL LOW (ref 4.22–5.81)
RBC: 2.71 MIL/uL — ABNORMAL LOW (ref 4.22–5.81)
RDW: 13.7 % (ref 11.5–15.5)
RDW: 13.9 % (ref 11.5–15.5)
WBC: 8.7 10*3/uL (ref 4.0–10.5)
WBC: 9.2 10*3/uL (ref 4.0–10.5)
nRBC: 0 % (ref 0.0–0.2)
nRBC: 0 % (ref 0.0–0.2)

## 2021-02-13 LAB — POCT I-STAT EG7
Acid-base deficit: 1 mmol/L (ref 0.0–2.0)
Acid-base deficit: 3 mmol/L — ABNORMAL HIGH (ref 0.0–2.0)
Bicarbonate: 22.1 mmol/L (ref 20.0–28.0)
Bicarbonate: 23.8 mmol/L (ref 20.0–28.0)
Calcium, Ion: 1.23 mmol/L (ref 1.15–1.40)
Calcium, Ion: 1.33 mmol/L (ref 1.15–1.40)
HCT: 20 % — ABNORMAL LOW (ref 39.0–52.0)
HCT: 21 % — ABNORMAL LOW (ref 39.0–52.0)
Hemoglobin: 6.8 g/dL — CL (ref 13.0–17.0)
Hemoglobin: 7.1 g/dL — ABNORMAL LOW (ref 13.0–17.0)
O2 Saturation: 66 %
O2 Saturation: 67 %
Potassium: 3.7 mmol/L (ref 3.5–5.1)
Potassium: 3.9 mmol/L (ref 3.5–5.1)
Sodium: 167 mmol/L (ref 135–145)
Sodium: 167 mmol/L (ref 135–145)
TCO2: 23 mmol/L (ref 22–32)
TCO2: 25 mmol/L (ref 22–32)
pCO2, Ven: 39.6 mmHg — ABNORMAL LOW (ref 44.0–60.0)
pCO2, Ven: 41.3 mmHg — ABNORMAL LOW (ref 44.0–60.0)
pH, Ven: 7.355 (ref 7.250–7.430)
pH, Ven: 7.368 (ref 7.250–7.430)
pO2, Ven: 35 mmHg (ref 32.0–45.0)
pO2, Ven: 36 mmHg (ref 32.0–45.0)

## 2021-02-13 LAB — COMPREHENSIVE METABOLIC PANEL
ALT: 11 U/L (ref 0–44)
ALT: 11 U/L (ref 0–44)
AST: 16 U/L (ref 15–41)
AST: 16 U/L (ref 15–41)
Albumin: 2.8 g/dL — ABNORMAL LOW (ref 3.5–5.0)
Albumin: 2.6 g/dL — ABNORMAL LOW (ref 3.5–5.0)
Alkaline Phosphatase: 34 U/L — ABNORMAL LOW (ref 38–126)
Alkaline Phosphatase: 41 U/L (ref 38–126)
BUN: 29 mg/dL — ABNORMAL HIGH (ref 6–20)
BUN: 29 mg/dL — ABNORMAL HIGH (ref 6–20)
CO2: 23 mmol/L (ref 22–32)
CO2: 23 mmol/L (ref 22–32)
Calcium: 9.6 mg/dL (ref 8.9–10.3)
Calcium: 9.2 mg/dL (ref 8.9–10.3)
Chloride: >130 mmol/L (ref 98–111)
Chloride: >130 mmol/L (ref 98–111)
Creatinine, Ser: 1.38 mg/dL — ABNORMAL HIGH (ref 0.61–1.24)
Creatinine, Ser: 1.29 mg/dL — ABNORMAL HIGH (ref 0.61–1.24)
GFR, Estimated: >60 mL/min (ref >60)
GFR, Estimated: >60 mL/min (ref >60)
Glucose, Bld: 164 mg/dL — ABNORMAL HIGH (ref 70–99)
Glucose, Bld: 168 mg/dL — ABNORMAL HIGH (ref 70–99)
Potassium: 2.7 mmol/L — CL (ref 3.5–5.1)
Potassium: 3.2 mmol/L — ABNORMAL LOW (ref 3.5–5.1)
Sodium: 162 mmol/L (ref 135–145)
Sodium: 162 mmol/L (ref 135–145)
Total Bilirubin: 1 mg/dL (ref 0.3–1.2)
Total Bilirubin: 0.7 mg/dL (ref 0.3–1.2)
Total Protein: 6.1 g/dL — ABNORMAL LOW (ref 6.5–8.1)
Total Protein: 5.9 g/dL — ABNORMAL LOW (ref 6.5–8.1)

## 2021-02-13 LAB — MAGNESIUM: Magnesium: 3.1 mg/dL — ABNORMAL HIGH (ref 1.7–2.4)

## 2021-02-13 LAB — URINE CULTURE
Culture: NO GROWTH
Special Requests: NORMAL

## 2021-02-13 LAB — CULTURE, RESPIRATORY W GRAM STAIN: Gram Stain: NONE SEEN

## 2021-02-13 SURGERY — RIGHT/LEFT HEART CATH AND CORONARY ANGIOGRAPHY
Anesthesia: LOCAL

## 2021-02-13 MED ORDER — ATROPINE SULFATE 1 MG/10ML IJ SOSY
PREFILLED_SYRINGE | INTRAMUSCULAR | Status: AC
  Filled 2021-02-13: qty 10

## 2021-02-13 MED ORDER — VERAPAMIL HCL 2.5 MG/ML IV SOLN
INTRAVENOUS | Status: DC | PRN
  Administered 2021-02-13: 14:00:00 10 mL via INTRA_ARTERIAL

## 2021-02-13 MED ORDER — EPINEPHRINE 1 MG/10ML IJ SOSY
PREFILLED_SYRINGE | INTRAMUSCULAR | Status: AC
  Filled 2021-02-13: qty 10

## 2021-02-13 MED ORDER — PANTOPRAZOLE SODIUM 40 MG IV SOLR
40.0000 mg | INTRAVENOUS | Status: DC
  Administered 2021-02-13: 11:00:00 40 mg via INTRAVENOUS
  Filled 2021-02-13: qty 40

## 2021-02-13 MED ORDER — POTASSIUM CHLORIDE 10 MEQ/100ML IV SOLN
10.0000 meq | INTRAVENOUS | Status: DC
  Filled 2021-02-13: qty 100

## 2021-02-13 MED ORDER — HEPARIN (PORCINE) IN NACL 1000-0.9 UT/500ML-% IV SOLN
INTRAVENOUS | Status: AC
  Filled 2021-02-13: qty 1000

## 2021-02-13 MED ORDER — IOHEXOL 350 MG/ML SOLN
INTRAVENOUS | Status: DC | PRN
  Administered 2021-02-13: 14:00:00 125 mL

## 2021-02-13 MED ORDER — POTASSIUM CHLORIDE 10 MEQ/100ML IV SOLN
10.0000 meq | INTRAVENOUS | Status: AC
  Administered 2021-02-13 (×4): 10 meq via INTRAVENOUS
  Filled 2021-02-13 (×4): qty 100

## 2021-02-13 MED ORDER — VERAPAMIL HCL 2.5 MG/ML IV SOLN
INTRAVENOUS | Status: AC
  Filled 2021-02-13: qty 2

## 2021-02-13 MED ORDER — EPINEPHRINE PF 1 MG/ML IJ SOLN
INTRAMUSCULAR | Status: DC | PRN
  Administered 2021-02-13 (×3): 1 mg

## 2021-02-13 MED ORDER — FREE WATER
200.0000 mL | Status: DC
  Administered 2021-02-13 (×2): 200 mL

## 2021-02-13 MED ORDER — CALCIUM CHLORIDE 10 % IV SOLN
INTRAVENOUS | Status: AC
  Filled 2021-02-13: qty 10

## 2021-02-13 MED ORDER — SODIUM CHLORIDE 0.9 % IV SOLN
2.0000 g | Freq: Three times a day (TID) | INTRAVENOUS | Status: DC
  Administered 2021-02-13: 11:00:00 2 g via INTRAVENOUS
  Filled 2021-02-13: qty 2

## 2021-02-13 MED ORDER — DEXTROSE-NACL 5-0.45 % IV SOLN: INTRAVENOUS | Status: DC

## 2021-02-13 MED ORDER — HEPARIN SODIUM (PORCINE) 1000 UNIT/ML IJ SOLN
INTRAMUSCULAR | Status: AC
  Filled 2021-02-13: qty 10

## 2021-02-13 MED ORDER — ATROPINE SULFATE 1 MG/10ML IJ SOSY
PREFILLED_SYRINGE | INTRAMUSCULAR | Status: DC | PRN
  Administered 2021-02-13: 1 mg via INTRAVENOUS

## 2021-02-13 MED ORDER — CALCIUM CHLORIDE 10 % IV SOLN
INTRAVENOUS | Status: DC | PRN
  Administered 2021-02-13: 1 g via INTRAVENOUS

## 2021-02-13 MED ORDER — POTASSIUM CHLORIDE 10 MEQ/50ML IV SOLN
10.0000 meq | INTRAVENOUS | Status: AC
  Administered 2021-02-13 (×2): 10 meq via INTRAVENOUS
  Filled 2021-02-13 (×4): qty 50

## 2021-02-13 MED ORDER — LIDOCAINE HCL (PF) 1 % IJ SOLN
INTRAMUSCULAR | Status: DC | PRN
  Administered 2021-02-13 (×2): 2 mL

## 2021-02-13 MED ORDER — LIDOCAINE HCL (PF) 1 % IJ SOLN
INTRAMUSCULAR | Status: AC
  Filled 2021-02-13: qty 30

## 2021-02-13 MED ORDER — ALBUMIN HUMAN 5 % IV SOLN
12.5000 g | Freq: Once | INTRAVENOUS | Status: AC
  Administered 2021-02-13: 11:00:00 12.5 g via INTRAVENOUS
  Filled 2021-02-13: qty 250

## 2021-02-13 MED ORDER — HEPARIN (PORCINE) IN NACL 1000-0.9 UT/500ML-% IV SOLN
INTRAVENOUS | Status: DC | PRN
  Administered 2021-02-13 (×2): 500 mL

## 2021-02-13 MED ORDER — CEFAZOLIN SODIUM-DEXTROSE 2-4 GM/100ML-% IV SOLN
2.0000 g | Freq: Three times a day (TID) | INTRAVENOUS | Status: DC
  Filled 2021-02-13 (×2): qty 100

## 2021-02-13 MED ORDER — DEXTROSE 5 % IV SOLN: INTRAVENOUS | Status: DC

## 2021-02-13 MED ORDER — HEPARIN SODIUM (PORCINE) 1000 UNIT/ML IJ SOLN
INTRAMUSCULAR | Status: DC | PRN
  Administered 2021-02-13: 3500 [IU] via INTRAVENOUS

## 2021-02-13 MED ORDER — POTASSIUM CHLORIDE 10 MEQ/100ML IV SOLN
10.0000 meq | INTRAVENOUS | Status: AC
  Administered 2021-02-13 (×2): 10 meq via INTRAVENOUS
  Filled 2021-02-13 (×2): qty 100

## 2021-02-13 SURGICAL SUPPLY — 24 items
CATH BALLN WEDGE 5F 110CM (CATHETERS) ×2 IMPLANT
CATH INFINITI 5 FR JL3.5 (CATHETERS) ×2 IMPLANT
CATH INFINITI 5FR AL1 (CATHETERS) ×2 IMPLANT
CATH INFINITI 5FR JL4 (CATHETERS) ×2 IMPLANT
CATH LAUNCHER 5F RADL (CATHETERS) IMPLANT
CATH LAUNCHER 6FR EBU 3 (CATHETERS) ×2 IMPLANT
CATH LAUNCHER 6FR EBU3.5 (CATHETERS) ×2 IMPLANT
CATH LAUNCHER 6FR JL3 (CATHETERS) ×2 IMPLANT
CATH OPTITORQUE TIG 4.0 5F (CATHETERS) ×2 IMPLANT
CATHETER LAUNCHER 5F RADL (CATHETERS) ×3
DEVICE RAD COMP TR BAND LRG (VASCULAR PRODUCTS) ×4 IMPLANT
ELECT DEFIB PAD ADLT CADENCE (PAD) ×2 IMPLANT
GLIDESHEATH SLEND SS 6F .021 (SHEATH) ×2 IMPLANT
GUIDEWIRE INQWIRE 1.5J.035X260 (WIRE) IMPLANT
INQWIRE 1.5J .035X260CM (WIRE) ×3
KIT HEART LEFT (KITS) ×3 IMPLANT
MAT PREVALON FULL STRYKER (MISCELLANEOUS) ×2 IMPLANT
PACK CARDIAC CATHETERIZATION (CUSTOM PROCEDURE TRAY) ×3 IMPLANT
SHEATH GLIDE SLENDER 4/5FR (SHEATH) ×2 IMPLANT
SHEATH PINNACLE 5F 10CM (SHEATH) ×2 IMPLANT
SHEATH PROBE COVER 6X72 (BAG) ×2 IMPLANT
TRANSDUCER W/STOPCOCK (MISCELLANEOUS) ×3 IMPLANT
TUBING CIL FLEX 10 FLL-RA (TUBING) ×3 IMPLANT
WIRE EMERALD 3MM-J .035X150CM (WIRE) ×2 IMPLANT

## 2021-02-14 ENCOUNTER — Encounter (HOSPITAL_COMMUNITY): Payer: Self-pay | Admitting: Cardiology

## 2021-02-15 LAB — CULTURE, BLOOD (ROUTINE X 2)
Culture: NO GROWTH
Culture: NO GROWTH
Special Requests: ADEQUATE
Special Requests: ADEQUATE

## 2021-02-27 NOTE — Interval H&P Note (Signed)
History and Physical Interval Note:  02/08/2021 1:11 PM  Richard Oneal  has presented today for surgery, with the diagnosis of STEMI.  The various methods of treatment have been discussed with the patient and family. After consideration of risks, benefits and other options for treatment, the patient has consented to  Procedure(s): Coronary/Graft Acute MI Revascularization (N/A) as a surgical intervention.  The patient's history has been reviewed, patient examined, no change in status, stable for surgery.  I have reviewed the patient's chart and labs.  Questions were answered to the patient's satisfaction.     Bryan Lemma

## 2021-02-27 NOTE — Progress Notes (Signed)
Obtained abg after recruitment maneuver for 30 minutes on 100%.

## 2021-02-27 NOTE — Progress Notes (Addendum)
Pharmacy Antibiotic Note  Richard Oneal is a 51 y.o. male admitted on 02/12/2021 with slurred speech and headache, found to have extensive progression of prior strokes.  He is being evaluated for organ donation.  Ct showed possible bladder obstruction, UTI and multifocal PNA.  Tracheal aspirate culture growing Klebsiella, preliminary result.  Pharmacy has been consulted for cefepime dosing.  Renal function improving, afebrile, WBC WNL.  Plan: Increase cefepime to 2gm IV Q8H Monitor renal fxn, micro data to de-escalate  KCL x 4 runs  Height: 5\' 6"  (167.6 cm) Weight: 71.6 kg (157 lb 13.6 oz) IBW/kg (Calculated) : 63.8  Temp (24hrs), Avg:98.5 F (36.9 C), Min:97.2 F (36.2 C), Max:100.8 F (38.2 C)  Recent Labs  Lab 02/12/21 1007 02/12/21 1553 02/12/21 1600 02/12/21 2203 02/25/2021 0406 02/14/2021 0916  WBC 9.2  --  7.5 7.4 8.7 9.2  CREATININE 1.46*  --  1.77* 1.45* 1.38* 1.29*  LATICACIDVEN  --  1.3  --   --   --   --      Estimated Creatinine Clearance: 61.8 mL/min (A) (by C-G formula based on SCr of 1.29 mg/dL (H)).    No Known Allergies  Cefepime 12/17 >>   12/13 MRSA PCR - negative 12/15 BCx - NGTD 12/16 TA - Kleb pneumo (preliminary) 12/17 UCx - negative  Mellany Dinsmore D. 1/18, PharmD, BCPS, BCCCP 02/04/2021, 10:47 AM

## 2021-02-27 NOTE — Discharge Summary (Signed)
°  Name: Richard Oneal MRN: [id number] DOB: 1970/12/25 51 y.o.  Date of Admission: 01/29/2021  8:35 AM Date of Discharge: 02/14/2021 Attending Physician: No att. 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    Hospital Course: Patient initially presented to [facility] ED for a headache, where he was noted to have slurred speech and a code stroke was activated. Stroke workup significant for bilateral vertebral artery and basilar artery occlusion, and patient was transferred to [facility] for revascularization on 12./13. Patient was taken to IR, however, was not able to gain access to the lumen of the L vertebral artery and developed collaterals in the bilateral PCAs which suggested possible chronic occlusion, therefore, procedure was aborted. Patient was intubated for airway protection and transferred to ICU. MRI showed acute infarcts within the left > right cerebellum, pons, and left thalamus. Throughout hospitalization, patient clinically deteriorated. Repeat MRI imaging on 12/15 showed progression of CVAs, with extensive infarctions in the cerebellum bilaterally that progressed, extensive infarction in the pons that progressed, and new  areas of infarct in the occipital periventricular white matter and splenium of corpus callosum, with new development of hydrocephalus. Patient was not a candidate for any neurosurgical intervention at this point. After long discussion with family, code status was changed to DNR and patient transitioned to comfort care. Patient was pronounced dead by neurological criteria at 1418 on 2021-03-05.  On 12/16, patient's family consented to organ donation and [name] was contacted. Organ donation workup began at this time and on March 08, 2023, patient was undergoing cardiac cath for possible donation. After patient's RHC, cardiologist noted that the patient began to get hypotensive and levophed was initiated. Left heart cath revealed ~80% left mainstem disease and during LHC, patient went into PEA arrest. CPR performed for 10 minutes, epi x 3 given with no ROSC. Time of death was called at 1439 on Mar 07, 2021.   SignedChauncey Mann, DO 02/14/2021, 2:39 PM

## 2021-02-27 NOTE — Progress Notes (Signed)
PCCM Progress Note  PCCM called to cath lab for code blue. After RHC evaluation, cardiologist noted hypotension and levophed was initiated. Left heart cath revealed significant left main disease (~80%). During LHC, patient went into PEA arrest. CPR performed for 20 minutes, epi x 3 given with no ROSC. Compressions were discontinued. Coordinated care to return patient to ICU. Honor bridge involved in discussion and will plan to contact family regarding further tissue donation.  Care Time: 45 minutes  Mechele Collin, M.D. [facility] Pulmonary/Critical Care Medicine 02/26/2021 4:33 PM   See Amion for personal pager For hours between 7 PM to 7 AM, please call Elink for urgent questions

## 2021-02-27 NOTE — H&P (View-Only) (Signed)
NAME:  Janiel Crisostomo, MRN:  [id number], DOB:  13-Sep-1970, LOS: 5 ADMISSION DATE:  02/12/2021, CONSULTATION DATE:  12/14 REFERRING MD:  Wilford Corner, CHIEF COMPLAINT:  Stroke, vent management   History of Present Illness:  Nichalos Brenton is a 51 y.o. M with PMH of HL who presented to [facility] ED for headache.  He was noted to have slurred speech and code stroke was activated.  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  LKW was the night before presentation, so was outside the window for TPA.     On arrival to [facility] was taken to IR, however were not able to gain access to the true lumen of the L vertebral artery and developed collaterals in the bilateral PCA's suggested possible chronic occlusion, therefore the procedure was aborted.  Pt left intubated and MRI ordered. PCCM consulted for ventilator management.   Pertinent  Medical History   has a past medical history of Dental caries and Hypercholesterolemia.  Significant Hospital Events: Including procedures, antibiotic start and stop dates in addition to other pertinent events   12/13 Transferred from [place] to [facility], thrombectomy unsuccessful, left intubated pending MRI and PCCM consult for vent management, on Cleviprex 12/14 Attempted SBT, however, patient too sedated/asleep to do so  12/15 progression of extensive infarct in cerebellum, pons, acute infarct in occipital periventricular white matter and splenium of corpus callosum and new mild hydrocephalus - NSG consulted, any surgery will not change prognosis  12/15 overnight had drop in BP, started on levo 12/16 Weaned off levophed. Patient pronounced dead by neurological criteria at 14:18. Family wishes to proceed with organ donation  12/17 On nicardipine overnight for HTN, dc'd in AM  Interim History / Subjective:  [place] requesting cardiac cath  Objective   Blood  pressure (!) 97/55, pulse (!) 103, temperature 98.8 F (37.1 C), resp. rate 16, height 5\' 6"  (1.676 m), weight 71.6 kg, SpO2 100 %. CVP:  [2 mmHg-13 mmHg] 6 mmHg  Vent Mode: PRVC FiO2 (%):  [70 %-100 %] 70 % Set Rate:  [16 bmp] 16 bmp Vt Set:  [550 mL] 550 mL PEEP:  [5 cmH20] 5 cmH20 Plateau Pressure:  [9 cmH20-20 cmH20] 17 cmH20   Intake/Output Summary (Last 24 hours) at Mar 05, 2021 0921 Last data filed at 03-05-21 0800 Gross per 24 hour  Intake 3693.7 ml  Output 3555 ml  Net 138.7 ml   Filed Weights   02/02/2021 1328 02/12/21 0545 Mar 05, 2021 0600  Weight: 80 kg 71.7 kg 71.6 kg   Physical Exam: General: Critically ill-appearing, unresponsive HENT: [place], AT, ETT in place Eyes: EOMI, no scleral icterus Respiratory: Clear to auscultation bilaterally.  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  -Coordinating care with [facility] for testing and evaluation: Requesting LHC and RHC -Cardiology consulted  Acute respiratory failure 2/2 above Klebsiella pneumonia -S/p bronchoscopy 02/25/2021 for donor evaluation: Mucopurulent secretions present -Full vent support. No plan for extubation  -  Cefepime. De-escalate pending susceptibilities  Hypernatremia -FWF, D5 gtt -Trend  Hypokalemia -Replete  -Trend  AKI - improving -Monitor UOP/Cr  Chronic anemia Stable -Transfuse for Hg  <7  N/V Probable stress ulcer Likely secondary to extension of cerebellar/brainstem stroke. Patient also had coffee ground emesis likely secondary to stress ulcer. - OG tube wall suctioning - PRN reglan, zofran - PPI   GOC: Currently being evaluated for organ donation. Labs pending. Coordinating care with [employer]. Will change code status to full code per [facility] protocol to maintain organ patency. Family aware of code status change and understand how this affects clinical management of patient.  Best Practice (right click and "Reselect all SmartList Selections" daily)   Diet/type: NPO DVT prophylaxis: SCD GI prophylaxis: PPI Lines: N/A Foley:  N/A Code Status:  full code Last date of multidisciplinary goals of care discussion [family updated 12/17]  Critical care time:    The patient is critically ill with multiple organ systems failure and requires high complexity decision making for assessment and support, frequent evaluation and titration of therapies, application of advanced monitoring technologies and extensive interpretation of multiple databases.    Mechele Collin, M.D. [facility] Pulmonary/Critical Care Medicine 2021-03-06 9:44 AM   Please see Amion for pager number to reach on-call Pulmonary and Critical Care Team.

## 2021-02-27 NOTE — Progress Notes (Signed)
Recruitment maneuver performed.  Will obtain ABG in 30 minutes.

## 2021-02-27 NOTE — Progress Notes (Signed)
°   02/14/2021 1429  Clinical Encounter Type  Visited With Patient not available  Visit Type Initial;Code  Referral From Nurse  Consult/Referral To Chaplain   Chaplain responded to Code Blue. No family present and Pt unavailable. Chaplain remains available once Pt is brought back to the unit.  This note was prepared by Paul Half, MDiv. Chaplain remains available as needed through the on-call pager: [phone].

## 2021-02-27 NOTE — Progress Notes (Addendum)
NAME:  Richard Oneal, MRN:  [id number], DOB:  13-Sep-1970, LOS: 5 ADMISSION DATE:  01/27/2021, CONSULTATION DATE:  12/14 REFERRING MD:  Wilford Corner, CHIEF COMPLAINT:  Stroke, vent management   History of Present Illness:  Richard Oneal is a 51 y.o. M with PMH of HL who presented to [facility] ED for headache.  He was noted to have slurred speech and code stroke was activated.  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  LKW was the night before presentation, so was outside the window for TPA.     On arrival to [facility] was taken to IR, however were not able to gain access to the true lumen of the L vertebral artery and developed collaterals in the bilateral PCA's suggested possible chronic occlusion, therefore the procedure was aborted.  Pt left intubated and MRI ordered. PCCM consulted for ventilator management.   Pertinent  Medical History   has a past medical history of Dental caries and Hypercholesterolemia.  Significant Hospital Events: Including procedures, antibiotic start and stop dates in addition to other pertinent events   12/13 Transferred from [place] to [facility], thrombectomy unsuccessful, left intubated pending MRI and PCCM consult for vent management, on Cleviprex 12/14 Attempted SBT, however, patient too sedated/asleep to do so  12/15 progression of extensive infarct in cerebellum, pons, acute infarct in occipital periventricular white matter and splenium of corpus callosum and new mild hydrocephalus - NSG consulted, any surgery will not change prognosis  12/15 overnight had drop in BP, started on levo 12/16 Weaned off levophed. Patient pronounced dead by neurological criteria at 14:18. Family wishes to proceed with organ donation  12/17 On nicardipine overnight for HTN, dc'd in AM  Interim History / Subjective:  [place] requesting cardiac cath  Objective   Blood  pressure (!) 97/55, pulse (!) 103, temperature 98.8 F (37.1 C), resp. rate 16, height 5\' 6"  (1.676 m), weight 71.6 kg, SpO2 100 %. CVP:  [2 mmHg-13 mmHg] 6 mmHg  Vent Mode: PRVC FiO2 (%):  [70 %-100 %] 70 % Set Rate:  [16 bmp] 16 bmp Vt Set:  [550 mL] 550 mL PEEP:  [5 cmH20] 5 cmH20 Plateau Pressure:  [9 cmH20-20 cmH20] 17 cmH20   Intake/Output Summary (Last 24 hours) at Mar 05, 2021 0921 Last data filed at 03-05-21 0800 Gross per 24 hour  Intake 3693.7 ml  Output 3555 ml  Net 138.7 ml   Filed Weights   02/21/2021 1328 02/12/21 0545 Mar 05, 2021 0600  Weight: 80 kg 71.7 kg 71.6 kg   Physical Exam: General: Critically ill-appearing, unresponsive HENT: [place], AT, ETT in place Eyes: EOMI, no scleral icterus Respiratory: Clear to auscultation bilaterally.  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  -Coordinating care with [facility] for testing and evaluation: Requesting LHC and RHC -Cardiology consulted  Acute respiratory failure 2/2 above Klebsiella pneumonia -S/p bronchoscopy 01/29/2021 for donor evaluation: Mucopurulent secretions present -Full vent support. No plan for extubation  -  Cefepime. De-escalate pending susceptibilities  Hypernatremia -FWF, D5 gtt -Trend  Hypokalemia -Replete  -Trend  AKI - improving -Monitor UOP/Cr  Chronic anemia Stable -Transfuse for Hg  <7  N/V Probable stress ulcer Likely secondary to extension of cerebellar/brainstem stroke. Patient also had coffee ground emesis likely secondary to stress ulcer. - OG tube wall suctioning - PRN reglan, zofran - PPI   GOC: Currently being evaluated for organ donation. Labs pending. Coordinating care with [employer]. Will change code status to full code per [facility] protocol to maintain organ patency. Family aware of code status change and understand how this affects clinical management of patient.  Best Practice (right click and "Reselect all SmartList Selections" daily)   Diet/type: NPO DVT prophylaxis: SCD GI prophylaxis: PPI Lines: N/A Foley:  N/A Code Status:  full code Last date of multidisciplinary goals of care discussion [family updated 12/17]  Critical care time:    The patient is critically ill with multiple organ systems failure and requires high complexity decision making for assessment and support, frequent evaluation and titration of therapies, application of advanced monitoring technologies and extensive interpretation of multiple databases.    Mechele Collin, M.D. [facility] Pulmonary/Critical Care Medicine 02/25/2021 9:44 AM   Please see Amion for pager number to reach on-call Pulmonary and Critical Care Team.

## 2021-02-27 DEATH — deceased
